# Patient Record
Sex: Female | Born: 1955 | Race: White | Hispanic: No | Marital: Married | Smoking: Never smoker
Health system: Southern US, Community
[De-identification: ages and names within clinical notes are randomized; demographics above are authoritative.]

## PROBLEM LIST (undated history)

## (undated) DIAGNOSIS — N6320 Unspecified lump in the left breast, unspecified quadrant: Secondary | ICD-10-CM

## (undated) DIAGNOSIS — I10 Essential (primary) hypertension: Secondary | ICD-10-CM

## (undated) DIAGNOSIS — R51 Headache: Secondary | ICD-10-CM

## (undated) DIAGNOSIS — R519 Headache, unspecified: Secondary | ICD-10-CM

## (undated) DIAGNOSIS — M1990 Unspecified osteoarthritis, unspecified site: Secondary | ICD-10-CM

## (undated) HISTORY — PX: TUBAL LIGATION: SHX77

## (undated) HISTORY — PX: APPENDECTOMY: SHX54

## (undated) HISTORY — PX: CHOLECYSTECTOMY: SHX55

## (undated) HISTORY — PX: TONSILLECTOMY: SUR1361

---

## 2006-10-06 ENCOUNTER — Encounter: Admission: RE | Admit: 2006-10-06 | Discharge: 2006-10-06 | Payer: Self-pay | Admitting: *Deleted

## 2010-06-27 ENCOUNTER — Encounter: Payer: Self-pay | Admitting: Family Medicine

## 2015-11-11 ENCOUNTER — Ambulatory Visit: Payer: Self-pay | Admitting: Physical Therapy

## 2016-08-04 ENCOUNTER — Other Ambulatory Visit: Payer: Self-pay | Admitting: Family Medicine

## 2016-08-04 DIAGNOSIS — R921 Mammographic calcification found on diagnostic imaging of breast: Secondary | ICD-10-CM

## 2016-08-04 DIAGNOSIS — N6489 Other specified disorders of breast: Secondary | ICD-10-CM

## 2016-08-16 ENCOUNTER — Ambulatory Visit
Admission: RE | Admit: 2016-08-16 | Discharge: 2016-08-16 | Disposition: A | Discharge status: Home / Self Care | Payer: 59 | Source: Ambulatory Visit | Attending: Family Medicine | Admitting: Family Medicine

## 2016-08-16 ENCOUNTER — Other Ambulatory Visit: Payer: Self-pay | Admitting: Family Medicine

## 2016-08-16 DIAGNOSIS — R921 Mammographic calcification found on diagnostic imaging of breast: Secondary | ICD-10-CM

## 2016-08-16 DIAGNOSIS — N6489 Other specified disorders of breast: Secondary | ICD-10-CM

## 2016-08-29 ENCOUNTER — Ambulatory Visit: Payer: Self-pay | Admitting: Surgery

## 2016-08-29 DIAGNOSIS — N6320 Unspecified lump in the left breast, unspecified quadrant: Secondary | ICD-10-CM

## 2016-08-29 NOTE — H&P (Signed)
History of Present Illness Caitlyn Cabrera. Caitlyn Smail MD; 08/29/2016 9:57 AM) Patient words: New-L breast intraductal papillomas.  The patient is a 61 year old female who presents with a breast mass. PCP - Dr. Gar Ponto Referred for evaluation of left lateral breast complex sclerosing lesion  This is a 61 year old female in good health who presents after recent routine screening mammogram. The patient has had previous core biopsies on the right side that showed only papillomas. However this mammogram showed some areas of asymmetry on the left. She had an area of distortion in the lateral left breast. She had a total of 3 stereotactic biopsies performed on 08/16/16. The left lateral area of distortion shows fibrocystic changes with usual ductal hyperplasia apicoectomy metaplasia and focal sclerosis but cannot exclude a complex sclerosing lesion. The other 2 areas of biopsy were benign with fibrocystic changes and usual ductal hyperplasia. She presents now to discuss possible excision of the lateral breast distortion site. This is marked with an X-shaped clip.  The patient has a family history of breast cancer in a maternal aunt. She has never had any previous breast surgery.  CLINICAL DATA: Left lateral breast architectural distortion, left medial breast calcifications and left upper outer quadrant nodule. Patient has a history of multiple left breast papillomas.  EXAM: LEFT BREAST STEREOTACTIC CORE NEEDLE BIOPSY  COMPARISON: Previous exams.  FINDINGS: The patient and I discussed the procedure of stereotactic-guided biopsy including benefits and alternatives. We discussed the high likelihood of a successful procedure. We discussed the risks of the procedure including infection, bleeding, tissue injury, clip migration, and inadequate sampling. Informed written consent was given. The usual time out protocol was performed immediately prior to the procedure.  Using sterile technique and 1%  Lidocaine as local anesthetic, under stereotactic guidance, a 9 gauge vacuum assisted device was used to perform core needle biopsy of left lateral breast distortion using a superior approach. Specimen radiograph was performed. At the conclusion of the procedure, a X shaped tissue marker clip was deployed into the biopsy cavity.  Using sterile technique and 1% Lidocaine as local anesthetic, under stereotactic guidance, a 9 gauge vacuum assisted device was used to perform core needle biopsy of left medial breast group of calcifications using a superior approach. Specimen radiograph was performed and marked for the pathologist. At the conclusion of the procedure, a ribbon shaped tissue marker clip was deployed into the biopsy cavity.  Using sterile technique and 1% Lidocaine as local anesthetic, under stereotactic guidance, a 9 gauge vacuum assisted device was used to perform core needle biopsy of left breast upper outer quadrant nodule using a superior approach. Specimen radiograph was performed and marked for the pathologist. At the conclusion of the procedure, a coil shaped tissue marker clip was deployed into the biopsy cavity.  Follow-up 2-view mammogram was performed and dictated separately.  IMPRESSION: Stereotactic-guided biopsy of left breast x 3. No apparent complications.  Electronically Signed: By: Fidela Salisbury M.D. On: 08/16/2016 12:24  ADDENDUM REPORT: 08/19/2016 09:00  ADDENDUM: Pathology revealed FIBROCYSTIC CHANGES WITH FLORID USUAL DUCTAL HYPERPLASIA, APOCRINE METAPLASIA, AND COLUMNAR CELL CHANGE, FOCAL SCLEROSIS PRESENT of the Left breast lateral distortion. There is focal sclerosis present and it is difficult to entirely exclude sampling of a complex sclerosing lesion but the findings are not definitive. This was found to be concordant byDr. Claudie Revering, with excision recommended. 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 FIBROCYSTIC CHANGES WITH FLORID USUAL DUCTAL HYPERPLASIA, APOCRINE METAPLASIA AND COLUMNAR  CELL CHANGE, ASSOCIATED CALCIFICATIONS of the Left breast, upper outer quadrant. This was found to be concordant by Dr. Claudie Revering. Pathology results were discussed with the patient by telephone. The patient reported doing well after the biopsies with tenderness at the sites. Post biopsy instructions and care were reviewed and questions were answered. The patient was encouraged to call [facility] for any additional concerns. 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 A surgical referral will be arranged by Dr. Gar Ponto of [facility] in [place], [place] per patient request. The patient was asked to return for Right diagnostic mammography and possible ultrasound in 6 months and informed a reminder notice would be sent regarding this appointment.  Pathology results reported by Terie Purser, RN on 08/19/2016.   Electronically Signed By: Fidela Salisbury M.D. On: 08/19/2016 09:00    Past Surgical History Malachi Bonds, CMA; 08/29/2016 9:20 AM) Appendectomy Breast Biopsy Bilateral. Gallbladder Surgery - Laparoscopic Tonsillectomy  Diagnostic Studies History Malachi Bonds, CMA; 08/29/2016 9:20 AM) Colonoscopy 5-10 years ago Mammogram never Pap Smear 1-5 years ago  Allergies Malachi Bonds, CMA; 08/29/2016 9:21 AM) No Known Drug Allergies 08/29/2016  Medication History (Chemira Jones, CMA; 08/29/2016 9:22 AM) Phentermine HCl (37.5MG  Tablet, Oral) Active. Lisinopril-Hydrochlorothiazide (10-12.5MG  Tablet, Oral) Active. Vitamin D3 (1000UNIT Capsule, Oral) Active. Medications Reconciled  Social History Malachi Bonds, CMA; 08/29/2016 9:20 AM) Caffeine use Coffee, Tea. No alcohol use No drug use Tobacco use  Former smoker.  Family History Malachi Bonds, CMA; 08/29/2016 9:20 AM) Breast Cancer Family Members In General. Diabetes Mellitus Father. Hypertension Father, Mother.  Pregnancy / Birth History Malachi Bonds, CMA; 08/29/2016 9:20 AM) Age at menarche 55 years. Age of menopause 7-60 Gravida 2 Irregular periods Maternal age 2-20 Para 2  Other Problems Malachi Bonds, CMA; 08/29/2016 9:20 AM) Arthritis High blood pressure     Review of Systems (Chemira Jones CMA; 08/29/2016 9:20 AM) General Not Present- Appetite Loss, Chills, Fatigue, Fever, Night Sweats, Weight Gain and Weight Loss. Skin Not Present- Change in Wart/Mole, Dryness, Hives, Jaundice, New Lesions, Non-Healing Wounds, Rash and Ulcer. HEENT Not Present- Earache, Hearing Loss, Hoarseness, Nose Bleed, Oral Ulcers, Ringing in the Ears, Seasonal Allergies, Sinus Pain, Sore Throat, Visual Disturbances, Wears glasses/contact lenses and Yellow Eyes. Respiratory Not Present- Bloody sputum, Chronic Cough, Difficulty Breathing, Snoring and Wheezing. Breast Not Present- Breast Mass, Breast Pain, Nipple Discharge and Skin Changes. Cardiovascular Not Present- Chest Pain, Difficulty Breathing Lying Down, Leg Cramps, Palpitations, Rapid Heart Rate, Shortness of Breath and Swelling of Extremities. Gastrointestinal Not Present- Abdominal Pain, Bloating, Bloody Stool, Change in Bowel Habits, Chronic diarrhea, Constipation, Difficulty Swallowing, Excessive gas, Gets full quickly at meals, Hemorrhoids, Indigestion, Nausea, Rectal Pain and Vomiting. Female Genitourinary Not Present- Frequency, Nocturia, Painful Urination, Pelvic Pain and Urgency. Musculoskeletal Not Present- Back Pain, Joint Pain, Joint Stiffness, Muscle Pain, Muscle Weakness and Swelling of Extremities. Neurological Not Present- Decreased Memory, Fainting, Headaches, Numbness, Seizures, Tingling, Tremor, Trouble walking and Weakness. Psychiatric Not Present- Anxiety,  Bipolar, Change in Sleep Pattern, Depression, Fearful and Frequent crying. Endocrine Not Present- Cold Intolerance, Excessive Hunger, Hair Changes, Heat Intolerance, Hot flashes and New Diabetes. Hematology Present- Easy Bruising. Not Present- Blood Thinners, Excessive bleeding, Gland problems, HIV and Persistent Infections.  Vitals (Chemira Jones CMA; 08/29/2016 9:20 AM) 08/29/2016 9:20 AM Weight: 223.6 lb Height: 64in Body Surface Area: 2.05 m Body Mass Index: 38.38 kg/m  Temp.: 98.42F(Oral)  Pulse: 89 (Regular)  BP: 122/88 (Sitting, Left Arm, Standard)      Physical Exam Rodman Key K. Shey Bartmess MD; 08/29/2016 9:58 AM)  The physical exam findings are as follows: Note:WDWN in NAD Eyes: Pupils equal, round; sclera anicteric HENT: Oral mucosa moist; good dentition Neck: No masses palpated, no thyromegaly Lungs: CTA bilaterally; normal respiratory effort Breasts: pendulous, symmetric; no nipple retraction or discharge; no axillary lymphadenopathy; bilateral fibrocystic changes without dominant masses; upper outer quadrant of left breast shows some residual bruising from biopsy CV: Regular rate and rhythm; no murmurs; extremities well-perfused with no edema Abd: +bowel sounds, soft, non-tender, no palpable organomegaly; no palpable hernias Skin: Warm, dry; no sign of jaundice Psychiatric - alert and oriented x 4; calm mood and affect    Assessment & Plan Rodman Key K. Ivie Savitt MD; 08/29/2016 9:52 AM)  MASS OF LEFT BREAST ON MAMMOGRAM (N63.20) Impression: Possible complex sclerosing lesion - left lateral breast 3:00  Current Plans Schedule for Surgery - Left radioactive seed localized lumpectomy. The surgical procedure has been discussed with the patient. Potential risks, benefits, alternative treatments, and expected outcomes have been explained. All of the patient's questions at this time have been answered. The likelihood of reaching the patient's treatment goal is good. The  patient understand the proposed surgical procedure and wishes to proceed.  The [place] system was checked and the patient is not on any chronic pain medication. We discussed her post-operative pain management plan.  Caitlyn Cabrera. Georgette Dover, MD, [facility]  General/ Trauma Surgery  08/29/2016 9:58 AM

## 2016-09-13 ENCOUNTER — Other Ambulatory Visit: Payer: Self-pay | Admitting: Surgery

## 2016-09-13 DIAGNOSIS — N6320 Unspecified lump in the left breast, unspecified quadrant: Secondary | ICD-10-CM

## 2016-10-20 ENCOUNTER — Encounter (HOSPITAL_BASED_OUTPATIENT_CLINIC_OR_DEPARTMENT_OTHER): Payer: Self-pay | Admitting: *Deleted

## 2016-10-21 ENCOUNTER — Other Ambulatory Visit: Payer: Self-pay

## 2016-10-21 ENCOUNTER — Encounter (HOSPITAL_BASED_OUTPATIENT_CLINIC_OR_DEPARTMENT_OTHER)
Admission: RE | Admit: 2016-10-21 | Discharge: 2016-10-21 | Disposition: A | Discharge status: Home / Self Care | Payer: BLUE CROSS/BLUE SHIELD | Source: Ambulatory Visit | Attending: Surgery | Admitting: Surgery

## 2016-10-21 DIAGNOSIS — I10 Essential (primary) hypertension: Secondary | ICD-10-CM | POA: Insufficient documentation

## 2016-10-21 DIAGNOSIS — Z01810 Encounter for preprocedural cardiovascular examination: Secondary | ICD-10-CM | POA: Diagnosis present

## 2016-10-21 NOTE — Progress Notes (Signed)
Pt given Boost drink by J.Linder RN and instructed to drink day of surgery with teach back method.

## 2016-10-26 ENCOUNTER — Ambulatory Visit
Admission: RE | Admit: 2016-10-26 | Discharge: 2016-10-26 | Disposition: A | Discharge status: Home / Self Care | Payer: BLUE CROSS/BLUE SHIELD | Source: Ambulatory Visit | Attending: Surgery | Admitting: Surgery

## 2016-10-26 ENCOUNTER — Other Ambulatory Visit: Payer: Self-pay | Admitting: Surgery

## 2016-10-26 DIAGNOSIS — N6320 Unspecified lump in the left breast, unspecified quadrant: Secondary | ICD-10-CM

## 2016-10-27 ENCOUNTER — Ambulatory Visit
Admission: RE | Admit: 2016-10-27 | Discharge: 2016-10-27 | Disposition: A | Discharge status: Home / Self Care | Payer: BLUE CROSS/BLUE SHIELD | Source: Ambulatory Visit | Attending: Surgery | Admitting: Surgery

## 2016-10-27 ENCOUNTER — Ambulatory Visit (HOSPITAL_BASED_OUTPATIENT_CLINIC_OR_DEPARTMENT_OTHER): Payer: BLUE CROSS/BLUE SHIELD | Admitting: Anesthesiology

## 2016-10-27 ENCOUNTER — Encounter (HOSPITAL_BASED_OUTPATIENT_CLINIC_OR_DEPARTMENT_OTHER)
Admission: RE | Disposition: A | Discharge status: Home / Self Care | Payer: Self-pay | Source: Ambulatory Visit | Attending: Surgery

## 2016-10-27 ENCOUNTER — Ambulatory Visit (HOSPITAL_BASED_OUTPATIENT_CLINIC_OR_DEPARTMENT_OTHER)
Admission: RE | Admit: 2016-10-27 | Discharge: 2016-10-27 | Disposition: A | Discharge status: Home / Self Care | Payer: BLUE CROSS/BLUE SHIELD | Source: Ambulatory Visit | Attending: Surgery | Admitting: Surgery

## 2016-10-27 ENCOUNTER — Ambulatory Visit: Admit: 2016-10-27 | Payer: BLUE CROSS/BLUE SHIELD

## 2016-10-27 ENCOUNTER — Ambulatory Visit
Admit: 2016-10-27 | Discharge: 2016-10-27 | Disposition: A | Discharge status: Home / Self Care | Payer: BLUE CROSS/BLUE SHIELD | Attending: Surgery | Admitting: Surgery

## 2016-10-27 ENCOUNTER — Encounter (HOSPITAL_BASED_OUTPATIENT_CLINIC_OR_DEPARTMENT_OTHER): Payer: Self-pay

## 2016-10-27 DIAGNOSIS — I10 Essential (primary) hypertension: Secondary | ICD-10-CM | POA: Diagnosis not present

## 2016-10-27 DIAGNOSIS — Z79899 Other long term (current) drug therapy: Secondary | ICD-10-CM | POA: Diagnosis not present

## 2016-10-27 DIAGNOSIS — D242 Benign neoplasm of left breast: Secondary | ICD-10-CM | POA: Insufficient documentation

## 2016-10-27 DIAGNOSIS — N6082 Other benign mammary dysplasias of left breast: Secondary | ICD-10-CM | POA: Insufficient documentation

## 2016-10-27 DIAGNOSIS — N6320 Unspecified lump in the left breast, unspecified quadrant: Secondary | ICD-10-CM

## 2016-10-27 DIAGNOSIS — N6092 Unspecified benign mammary dysplasia of left breast: Secondary | ICD-10-CM | POA: Diagnosis not present

## 2016-10-27 DIAGNOSIS — Z803 Family history of malignant neoplasm of breast: Secondary | ICD-10-CM | POA: Insufficient documentation

## 2016-10-27 DIAGNOSIS — Z87891 Personal history of nicotine dependence: Secondary | ICD-10-CM | POA: Insufficient documentation

## 2016-10-27 HISTORY — DX: Unspecified osteoarthritis, unspecified site: M19.90

## 2016-10-27 HISTORY — PX: BREAST LUMPECTOMY WITH RADIOACTIVE SEED LOCALIZATION: SHX6424

## 2016-10-27 HISTORY — DX: Headache: R51

## 2016-10-27 HISTORY — DX: Essential (primary) hypertension: I10

## 2016-10-27 HISTORY — DX: Headache, unspecified: R51.9

## 2016-10-27 HISTORY — DX: Unspecified lump in the left breast, unspecified quadrant: N63.20

## 2016-10-27 SURGERY — BREAST LUMPECTOMY WITH RADIOACTIVE SEED LOCALIZATION
Anesthesia: General | Site: Breast | Laterality: Left

## 2016-10-27 MED ORDER — GABAPENTIN 300 MG PO CAPS
ORAL_CAPSULE | ORAL | Status: AC
  Filled 2016-10-27: qty 1

## 2016-10-27 MED ORDER — BUPIVACAINE-EPINEPHRINE 0.25% -1:200000 IJ SOLN
INTRAMUSCULAR | Status: DC | PRN
  Administered 2016-10-27: 10 mL

## 2016-10-27 MED ORDER — DEXAMETHASONE SODIUM PHOSPHATE 4 MG/ML IJ SOLN
INTRAMUSCULAR | Status: DC | PRN
  Administered 2016-10-27: 10 mg via INTRAVENOUS

## 2016-10-27 MED ORDER — MIDAZOLAM HCL 2 MG/2ML IJ SOLN
INTRAMUSCULAR | Status: AC
  Filled 2016-10-27: qty 2

## 2016-10-27 MED ORDER — MIDAZOLAM HCL 2 MG/2ML IJ SOLN
1.0000 mg | INTRAMUSCULAR | Status: DC | PRN
  Administered 2016-10-27: 2 mg via INTRAVENOUS

## 2016-10-27 MED ORDER — LACTATED RINGERS IV SOLN
INTRAVENOUS | Status: DC
  Administered 2016-10-27 (×2): via INTRAVENOUS

## 2016-10-27 MED ORDER — PROPOFOL 10 MG/ML IV BOLUS
INTRAVENOUS | Status: AC
Start: 2016-10-27 — End: 2016-10-27
  Filled 2016-10-27: qty 20

## 2016-10-27 MED ORDER — HYDROCODONE-ACETAMINOPHEN 5-325 MG PO TABS: 1.0000 | ORAL_TABLET | Freq: Four times a day (QID) | ORAL | 0 refills | Status: AC | PRN

## 2016-10-27 MED ORDER — CELECOXIB 200 MG PO CAPS
ORAL_CAPSULE | ORAL | Status: AC
  Filled 2016-10-27: qty 2

## 2016-10-27 MED ORDER — ACETAMINOPHEN 500 MG PO TABS
ORAL_TABLET | ORAL | Status: AC
  Filled 2016-10-27: qty 2

## 2016-10-27 MED ORDER — PROMETHAZINE HCL 25 MG/ML IJ SOLN: 6.2500 mg | INTRAMUSCULAR | Status: DC | PRN

## 2016-10-27 MED ORDER — SCOPOLAMINE 1 MG/3DAYS TD PT72: 1.0000 | MEDICATED_PATCH | Freq: Once | TRANSDERMAL | Status: DC | PRN

## 2016-10-27 MED ORDER — FENTANYL CITRATE (PF) 100 MCG/2ML IJ SOLN
50.0000 ug | INTRAMUSCULAR | Status: AC | PRN
  Administered 2016-10-27: 50 ug via INTRAVENOUS
  Administered 2016-10-27 (×2): 25 ug via INTRAVENOUS

## 2016-10-27 MED ORDER — HYDROCODONE-ACETAMINOPHEN 5-325 MG PO TABS
ORAL_TABLET | ORAL | Status: AC
  Filled 2016-10-27: qty 1

## 2016-10-27 MED ORDER — HYDROCODONE-ACETAMINOPHEN 5-325 MG PO TABS
1.0000 | ORAL_TABLET | Freq: Once | ORAL | Status: AC
  Administered 2016-10-27: 1 via ORAL

## 2016-10-27 MED ORDER — CEFAZOLIN SODIUM-DEXTROSE 2-4 GM/100ML-% IV SOLN
2.0000 g | INTRAVENOUS | Status: AC
Start: 2016-10-27 — End: 2016-10-27
  Administered 2016-10-27: 2 g via INTRAVENOUS

## 2016-10-27 MED ORDER — CELECOXIB 400 MG PO CAPS
400.0000 mg | ORAL_CAPSULE | ORAL | Status: AC
  Administered 2016-10-27: 400 mg via ORAL

## 2016-10-27 MED ORDER — ACETAMINOPHEN 500 MG PO TABS
1000.0000 mg | ORAL_TABLET | ORAL | Status: AC
Start: 2016-10-27 — End: 2016-10-27
  Administered 2016-10-27: 1000 mg via ORAL

## 2016-10-27 MED ORDER — DEXAMETHASONE SODIUM PHOSPHATE 10 MG/ML IJ SOLN
INTRAMUSCULAR | Status: AC
  Filled 2016-10-27: qty 1

## 2016-10-27 MED ORDER — LIDOCAINE 2% (20 MG/ML) 5 ML SYRINGE
INTRAMUSCULAR | Status: DC | PRN
  Administered 2016-10-27: 60 mg via INTRAVENOUS

## 2016-10-27 MED ORDER — ONDANSETRON HCL 4 MG/2ML IJ SOLN
INTRAMUSCULAR | Status: AC
  Filled 2016-10-27: qty 2

## 2016-10-27 MED ORDER — LIDOCAINE 2% (20 MG/ML) 5 ML SYRINGE
INTRAMUSCULAR | Status: AC
  Filled 2016-10-27: qty 5

## 2016-10-27 MED ORDER — KETOROLAC TROMETHAMINE 30 MG/ML IJ SOLN: 30.0000 mg | Freq: Once | INTRAMUSCULAR | Status: DC | PRN

## 2016-10-27 MED ORDER — HYDROCODONE-ACETAMINOPHEN 7.5-325 MG PO TABS: 1.0000 | ORAL_TABLET | Freq: Once | ORAL | Status: DC | PRN

## 2016-10-27 MED ORDER — HYDROMORPHONE HCL 1 MG/ML IJ SOLN: 0.2500 mg | INTRAMUSCULAR | Status: DC | PRN

## 2016-10-27 MED ORDER — CEFAZOLIN SODIUM-DEXTROSE 2-4 GM/100ML-% IV SOLN
INTRAVENOUS | Status: AC
  Filled 2016-10-27: qty 100

## 2016-10-27 MED ORDER — GABAPENTIN 300 MG PO CAPS
300.0000 mg | ORAL_CAPSULE | ORAL | Status: AC
  Administered 2016-10-27: 300 mg via ORAL

## 2016-10-27 MED ORDER — PROPOFOL 10 MG/ML IV BOLUS
INTRAVENOUS | Status: DC | PRN
  Administered 2016-10-27: 200 mg via INTRAVENOUS

## 2016-10-27 MED ORDER — CHLORHEXIDINE GLUCONATE CLOTH 2 % EX PADS: 6.0000 | MEDICATED_PAD | Freq: Once | CUTANEOUS | Status: DC

## 2016-10-27 MED ORDER — EPHEDRINE SULFATE-NACL 50-0.9 MG/10ML-% IV SOSY
PREFILLED_SYRINGE | INTRAVENOUS | Status: DC | PRN
  Administered 2016-10-27: 5 mg via INTRAVENOUS
  Administered 2016-10-27: 10 mg via INTRAVENOUS

## 2016-10-27 MED ORDER — ONDANSETRON HCL 4 MG/2ML IJ SOLN
INTRAMUSCULAR | Status: DC | PRN
  Administered 2016-10-27: 4 mg via INTRAVENOUS

## 2016-10-27 MED ORDER — FENTANYL CITRATE (PF) 100 MCG/2ML IJ SOLN
INTRAMUSCULAR | Status: AC
  Filled 2016-10-27: qty 2

## 2016-10-27 SURGICAL SUPPLY — 57 items
APL SKNCLS STERI-STRIP NONHPOA (GAUZE/BANDAGES/DRESSINGS) ×1
APPLIER CLIP 9.375 MED OPEN (MISCELLANEOUS) ×3
APR CLP MED 9.3 20 MLT OPN (MISCELLANEOUS) ×1
BENZOIN TINCTURE PRP APPL 2/3 (GAUZE/BANDAGES/DRESSINGS) ×3 IMPLANT
BLADE HEX COATED 2.75 (ELECTRODE) ×3 IMPLANT
BLADE SURG 15 STRL LF DISP TIS (BLADE) ×1 IMPLANT
BLADE SURG 15 STRL SS (BLADE) ×3
CANISTER SUC SOCK COL 7IN (MISCELLANEOUS) ×3 IMPLANT
CANISTER SUCT 1200ML W/VALVE (MISCELLANEOUS) ×3 IMPLANT
CHLORAPREP W/TINT 26ML (MISCELLANEOUS) ×3 IMPLANT
CLIP APPLIE 9.375 MED OPEN (MISCELLANEOUS) ×1 IMPLANT
CLOSURE WOUND 1/2 X4 (GAUZE/BANDAGES/DRESSINGS) ×1
COVER BACK TABLE 60X90IN (DRAPES) ×3 IMPLANT
COVER MAYO STAND STRL (DRAPES) ×3 IMPLANT
COVER PROBE W GEL 5X96 (DRAPES) ×3 IMPLANT
DECANTER SPIKE VIAL GLASS SM (MISCELLANEOUS) IMPLANT
DEVICE DUBIN W/COMP PLATE 8390 (MISCELLANEOUS) ×6 IMPLANT
DRAPE LAPAROTOMY 100X72 PEDS (DRAPES) ×3 IMPLANT
DRAPE UTILITY XL STRL (DRAPES) ×3 IMPLANT
DRSG TEGADERM 4X4.75 (GAUZE/BANDAGES/DRESSINGS) ×3 IMPLANT
ELECT BLADE 4.0 EZ CLEAN MEGAD (MISCELLANEOUS) ×3
ELECT REM PT RETURN 9FT ADLT (ELECTROSURGICAL) ×3
ELECTRODE BLDE 4.0 EZ CLN MEGD (MISCELLANEOUS) ×1 IMPLANT
ELECTRODE REM PT RTRN 9FT ADLT (ELECTROSURGICAL) ×1 IMPLANT
GAUZE SPONGE 4X4 12PLY STRL LF (GAUZE/BANDAGES/DRESSINGS) ×3 IMPLANT
GLOVE BIO SURGEON STRL SZ7 (GLOVE) ×3 IMPLANT
GLOVE BIOGEL PI IND STRL 7.0 (GLOVE) ×2 IMPLANT
GLOVE BIOGEL PI IND STRL 7.5 (GLOVE) ×1 IMPLANT
GLOVE BIOGEL PI INDICATOR 7.0 (GLOVE) ×4
GLOVE BIOGEL PI INDICATOR 7.5 (GLOVE) ×2
GLOVE ECLIPSE 6.5 STRL STRAW (GLOVE) ×3 IMPLANT
GOWN STRL REUS W/ TWL LRG LVL3 (GOWN DISPOSABLE) ×2 IMPLANT
GOWN STRL REUS W/TWL LRG LVL3 (GOWN DISPOSABLE) ×6
ILLUMINATOR WAVEGUIDE N/F (MISCELLANEOUS) IMPLANT
KIT MARKER MARGIN INK (KITS) ×3 IMPLANT
LIGHT WAVEGUIDE WIDE FLAT (MISCELLANEOUS) ×3 IMPLANT
NEEDLE HYPO 25X1 1.5 SAFETY (NEEDLE) ×3 IMPLANT
NS IRRIG 1000ML POUR BTL (IV SOLUTION) ×3 IMPLANT
PACK BASIN DAY SURGERY FS (CUSTOM PROCEDURE TRAY) ×3 IMPLANT
PENCIL BUTTON HOLSTER BLD 10FT (ELECTRODE) ×3 IMPLANT
SLEEVE SCD COMPRESS KNEE MED (MISCELLANEOUS) ×3 IMPLANT
SPONGE GAUZE 2X2 8PLY STER LF (GAUZE/BANDAGES/DRESSINGS)
SPONGE GAUZE 2X2 8PLY STRL LF (GAUZE/BANDAGES/DRESSINGS) IMPLANT
SPONGE LAP 18X18 X RAY DECT (DISPOSABLE) IMPLANT
SPONGE LAP 4X18 X RAY DECT (DISPOSABLE) ×3 IMPLANT
STRIP CLOSURE SKIN 1/2X4 (GAUZE/BANDAGES/DRESSINGS) ×2 IMPLANT
SUT MON AB 4-0 PC3 18 (SUTURE) ×3 IMPLANT
SUT SILK 2 0 SH (SUTURE) IMPLANT
SUT VIC AB 3-0 SH 27 (SUTURE) ×3
SUT VIC AB 3-0 SH 27X BRD (SUTURE) ×1 IMPLANT
SYR BULB 3OZ (MISCELLANEOUS) ×3 IMPLANT
SYR CONTROL 10ML LL (SYRINGE) ×3 IMPLANT
TOWEL OR 17X24 6PK STRL BLUE (TOWEL DISPOSABLE) ×3 IMPLANT
TOWEL OR NON WOVEN STRL DISP B (DISPOSABLE) ×3 IMPLANT
TUBE CONNECTING 20'X1/4 (TUBING) ×1
TUBE CONNECTING 20X1/4 (TUBING) ×2 IMPLANT
YANKAUER SUCT BULB TIP NO VENT (SUCTIONS) ×3 IMPLANT

## 2016-10-27 NOTE — Anesthesia Procedure Notes (Signed)
Procedure Name: LMA Insertion Date/Time: 10/27/2016 9:41 AM Performed by: Denna Haggard D Pre-anesthesia Checklist: Patient identified, Emergency Drugs available, Suction available and Patient being monitored Patient Re-evaluated:Patient Re-evaluated prior to inductionOxygen Delivery Method: Circle system utilized Preoxygenation: Pre-oxygenation with 100% oxygen Intubation Type: IV induction Ventilation: Mask ventilation without difficulty LMA: LMA inserted LMA Size: 4.0 Number of attempts: 1 Airway Equipment and Method: Bite block Placement Confirmation: positive ETCO2 Tube secured with: Tape Dental Injury: Teeth and Oropharynx as per pre-operative assessment

## 2016-10-27 NOTE — Anesthesia Preprocedure Evaluation (Signed)
Anesthesia Evaluation  Patient identified by MRN, date of birth, ID band Patient awake    Reviewed: Allergy & Precautions, NPO status , Patient's Chart, lab work & pertinent test results  Airway Mallampati: II  TM Distance: >3 FB Neck ROM: Full    Dental no notable dental hx.    Pulmonary neg pulmonary ROS,    Pulmonary exam normal breath sounds clear to auscultation       Cardiovascular hypertension, negative cardio ROS Normal cardiovascular exam Rhythm:Regular Rate:Normal     Neuro/Psych negative neurological ROS  negative psych ROS   GI/Hepatic negative GI ROS, Neg liver ROS,   Endo/Other  negative endocrine ROS  Renal/GU negative Renal ROS  negative genitourinary   Musculoskeletal negative musculoskeletal ROS (+)   Abdominal   Peds negative pediatric ROS (+)  Hematology negative hematology ROS (+)   Anesthesia Other Findings   Reproductive/Obstetrics negative OB ROS                             Anesthesia Physical Anesthesia Plan  ASA: II  Anesthesia Plan: General   Post-op Pain Management:    Induction: Intravenous  Airway Management Planned: LMA  Additional Equipment:   Intra-op Plan:   Post-operative Plan: Extubation in OR  Informed Consent: I have reviewed the patients History and Physical, chart, labs and discussed the procedure including the risks, benefits and alternatives for the proposed anesthesia with the patient or authorized representative who has indicated his/her understanding and acceptance.   Dental advisory given  Plan Discussed with:   Anesthesia Plan Comments:         Anesthesia Quick Evaluation

## 2016-10-27 NOTE — Discharge Instructions (Signed)
[facility] °Office Phone Number [phone] ° °BREAST BIOPSY/ PARTIAL MASTECTOMY: POST OP INSTRUCTIONS ° °Always review your discharge instruction sheet given to you by the facility where your surgery was performed. ° °IF YOU HAVE DISABILITY OR FAMILY LEAVE FORMS, YOU MUST BRING THEM TO THE OFFICE FOR PROCESSING.  DO NOT GIVE THEM TO YOUR DOCTOR. ° °1. A prescription for pain medication may be given to you upon discharge.  Take your pain medication as prescribed, if needed.  If narcotic pain medicine is not needed, then you may take acetaminophen (Tylenol) or ibuprofen (Advil) as needed. °2. Take your usually prescribed medications unless otherwise directed °3. If you need a refill on your pain medication, please contact your pharmacy.  They will contact our office to request authorization.  Prescriptions will not be filled after 5pm or on week-ends. °4. You should eat very light the first 24 hours after surgery, such as soup, crackers, pudding, etc.  Resume your normal diet the day after surgery. °5. Most patients will experience some swelling and bruising in the breast.  Ice packs and a good support bra will help.  Swelling and bruising can take several days to resolve.  °6. It is common to experience some constipation if taking pain medication after surgery.  Increasing fluid intake and taking a stool softener will usually help or prevent this problem from occurring.  A mild laxative (Milk of Magnesia or Miralax) should be taken according to package directions if there are no bowel movements after 48 hours. °7. Unless discharge instructions indicate otherwise, you may remove your bandages 24-48 hours after surgery, and you may shower at that time.  You may have steri-strips (small skin tapes) in place directly over the incision.  These strips should be left on the skin for 7-10 days.  If your surgeon used skin glue on the incision, you may shower in 24 hours.  The glue will flake off over the  next 2-3 weeks.  Any sutures or staples will be removed at the office during your follow-up visit. °8. ACTIVITIES:  You may resume regular daily activities (gradually increasing) beginning the next day.  Wearing a good support bra or sports bra minimizes pain and swelling.  You may have sexual intercourse when it is comfortable. °a. You may drive when you no longer are taking prescription pain medication, you can comfortably wear a seatbelt, and you can safely maneuver your car and apply brakes. °b. RETURN TO WORK:  ______________________________________________________________________________________ °9. You should see your doctor in the office for a follow-up appointment approximately two weeks after your surgery.  Your doctor’s nurse will typically make your follow-up appointment when she calls you with your pathology report.  Expect your pathology report 2-3 business days after your surgery.  You may call to check if you do not hear from us after three days. °10. OTHER INSTRUCTIONS: _______________________________________________________________________________________________ _____________________________________________________________________________________________________________________________________ °_____________________________________________________________________________________________________________________________________ °_____________________________________________________________________________________________________________________________________ ° °WHEN TO CALL YOUR DOCTOR: °1. Fever over 101.0 °2. Nausea and/or vomiting. °3. Extreme swelling or bruising. °4. Continued bleeding from incision. °5. Increased pain, redness, or drainage from the incision. ° °The clinic staff is available to answer your questions during regular business hours.  Please don’t hesitate to call and ask to speak to one of the nurses for clinical concerns.  If you have a medical emergency, go to the nearest  emergency room or call 911.  A surgeon from [facility] is always on call at the hospital. ° °For further questions, please visit [email or web address]  ° ° ° ° °  Post Anesthesia Home Care Instructions ° °Activity: °Get plenty of rest for the remainder of the day. A responsible individual must stay with you for 24 hours following the procedure.  °For the next 24 hours, DO NOT: °-Drive a car °-Operate machinery °-Drink alcoholic beverages °-Take any medication unless instructed by your physician °-Make any legal decisions or sign important papers. ° °Meals: °Start with liquid foods such as gelatin or soup. Progress to regular foods as tolerated. Avoid greasy, spicy, heavy foods. If nausea and/or vomiting occur, drink only clear liquids until the nausea and/or vomiting subsides. Call your physician if vomiting continues. ° °Special Instructions/Symptoms: °Your throat may feel dry or sore from the anesthesia or the breathing tube placed in your throat during surgery. If this causes discomfort, gargle with warm salt water. The discomfort should disappear within 24 hours. ° °If you had a scopolamine patch placed behind your ear for the management of post- operative nausea and/or vomiting: ° °1. The medication in the patch is effective for 72 hours, after which it should be removed.  Wrap patch in a tissue and discard in the trash. Wash hands thoroughly with soap and water. °2. You may remove the patch earlier than 72 hours if you experience unpleasant side effects which may include dry mouth, dizziness or visual disturbances. °3. Avoid touching the patch. Wash your hands with soap and water after contact with the patch. °  ° °

## 2016-10-27 NOTE — H&P (Signed)
History of Present Illness Caitlyn Cabrera. Caitlyn Muhlestein MD; 08/29/2016 9:57 AM) Patient words: New-L breast intraductal papillomas.  The patient is a 61 year old female who presents with a breast mass. PCP - Dr. Gar Ponto Referred for evaluation of left lateral breast complex sclerosing lesion  This is a 61 year old female in good health who presents after recent routine screening mammogram. The patient has had previous core biopsies on the right side that showed only papillomas. However this mammogram showed some areas of asymmetry on the left. She had an area of distortion in the lateral left breast. She had a total of 3 stereotactic biopsies performed on 08/16/16. The left lateral area of distortion shows fibrocystic changes with usual ductal hyperplasia apicoectomy metaplasia and focal sclerosis but cannot exclude a complex sclerosing lesion. The other 2 areas of biopsy were benign with fibrocystic changes and usual ductal hyperplasia. She presents now to discuss possible excision of the lateral breast distortion site. This is marked with an X-shaped clip.  The patient had two previous biopsies at [facility] in the left breast that showed intraductal papillomas.  The patient has a family history of breast cancer in a maternal aunt. She has never had any previous breast surgery.  CLINICAL DATA: Left lateral breast architectural distortion, left medial breast calcifications and left upper outer quadrant nodule. Patient has a history of multiple left breast papillomas.  EXAM: LEFT BREAST STEREOTACTIC CORE NEEDLE BIOPSY  COMPARISON: Previous exams.  FINDINGS: The patient and I discussed the procedure of stereotactic-guided biopsy including benefits and alternatives. We discussed the high likelihood of a successful procedure. We discussed the risks of the procedure including infection, bleeding, tissue injury, clip migration, and inadequate sampling. Informed written consent  was given. The usual time out protocol was performed immediately prior to the procedure.  Using sterile technique and 1% Lidocaine as local anesthetic, under stereotactic guidance, a 9 gauge vacuum assisted device was used to perform core needle biopsy of left lateral breast distortion using a superior approach. Specimen radiograph was performed. At the conclusion of the procedure, a X shaped tissue marker clip was deployed into the biopsy cavity.  Using sterile technique and 1% Lidocaine as local anesthetic, under stereotactic guidance, a 9 gauge vacuum assisted device was used to perform core needle biopsy of left medial breast group of calcifications using a superior approach. Specimen radiograph was performed and marked for the pathologist. At the conclusion of the procedure, a ribbon shaped tissue marker clip was deployed into the biopsy cavity.  Using sterile technique and 1% Lidocaine as local anesthetic, under stereotactic guidance, a 9 gauge vacuum assisted device was used to perform core needle biopsy of left breast upper outer quadrant nodule using a superior approach. Specimen radiograph was performed and marked for the pathologist. At the conclusion of the procedure, a coil shaped tissue marker clip was deployed into the biopsy cavity.  Follow-up 2-view mammogram was performed and dictated separately.  IMPRESSION: Stereotactic-guided biopsy of left breast x 3. No apparent complications.  Electronically Signed: By: Fidela Salisbury M.D. On: 08/16/2016 12:24  ADDENDUM REPORT: 08/19/2016 09:00  ADDENDUM: Pathology revealed FIBROCYSTIC CHANGES WITH FLORID USUAL DUCTAL HYPERPLASIA, APOCRINE METAPLASIA, AND COLUMNAR CELL CHANGE, FOCAL SCLEROSIS PRESENT of the Left breast lateral distortion. There is focal sclerosis present and it is difficult to entirely exclude sampling of a complex sclerosing lesion but the findings are not definitive. This was found  to be concordant byDr. Claudie Revering, with excision recommended. Pathology revealed [place]  of the Left breast medial calcifications. FIBROCYSTIC CHANGES WITH FLORID USUAL DUCTAL HYPERPLASIA, APOCRINE METAPLASIA AND COLUMNAR CELL CHANGE, ASSOCIATED CALCIFICATIONS of the Left breast, upper outer quadrant. This was found to be concordant by Dr. Claudie Revering. Pathology results were discussed with the patient by telephone. The patient reported doing well after the biopsies with tenderness at the sites. Post biopsy instructions and care were reviewed and questions were answered. The patient was encouraged to call [facility] for any additional concerns. 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 A surgical referral will be arranged by Dr. Gar Ponto of [facility] in [place], [place] per patient request. The patient was asked to return for Right diagnostic mammography and possible ultrasound in 6 months and informed a reminder notice would be sent regarding this appointment.  Pathology results reported by Terie Purser, RN on 08/19/2016.   Electronically Signed By: Fidela Salisbury M.D. On: 08/19/2016 09:00    Past Surgical History Appendectomy Breast Biopsy Bilateral. Gallbladder Surgery - Laparoscopic Tonsillectomy  Diagnostic Studies History  Colonoscopy 5-10 years ago Mammogram never Pap Smear 1-5 years ago  Allergies  No Known Drug Allergies 08/29/2016  Medication History  Phentermine HCl (37.5MG  Tablet, Oral) Active. Lisinopril-Hydrochlorothiazide (10-12.5MG  Tablet, Oral) Active. Vitamin D3 (1000UNIT Capsule, Oral) Active. Medications Reconciled  Social History  Caffeine use Coffee, Tea. No alcohol use No drug use Tobacco use Former smoker.  Family History  Breast Cancer  Family Members In General. Diabetes Mellitus Father. Hypertension Father, Mother.  Pregnancy / Birth History  Age at menarche 53 years. Age of menopause 19-60 Gravida 2 Irregular periods Maternal age 110-20 Para 2  Other Problems Arthritis High blood pressure     Review of Systems  General Not Present- Appetite Loss, Chills, Fatigue, Fever, Night Sweats, Weight Gain and Weight Loss. Skin Not Present- Change in Wart/Mole, Dryness, Hives, Jaundice, New Lesions, Non-Healing Wounds, Rash and Ulcer. HEENT Not Present- Earache, Hearing Loss, Hoarseness, Nose Bleed, Oral Ulcers, Ringing in the Ears, Seasonal Allergies, Sinus Pain, Sore Throat, Visual Disturbances, Wears glasses/contact lenses and Yellow Eyes. Respiratory Not Present- Bloody sputum, Chronic Cough, Difficulty Breathing, Snoring and Wheezing. Breast Not Present- Breast Mass, Breast Pain, Nipple Discharge and Skin Changes. Cardiovascular Not Present- Chest Pain, Difficulty Breathing Lying Down, Leg Cramps, Palpitations, Rapid Heart Rate, Shortness of Breath and Swelling of Extremities. Gastrointestinal Not Present- Abdominal Pain, Bloating, Bloody Stool, Change in Bowel Habits, Chronic diarrhea, Constipation, Difficulty Swallowing, Excessive gas, Gets full quickly at meals, Hemorrhoids, Indigestion, Nausea, Rectal Pain and Vomiting. Female Genitourinary Not Present- Frequency, Nocturia, Painful Urination, Pelvic Pain and Urgency. Musculoskeletal Not Present- Back Pain, Joint Pain, Joint Stiffness, Muscle Pain, Muscle Weakness and Swelling of Extremities. Neurological Not Present- Decreased Memory, Fainting, Headaches, Numbness, Seizures, Tingling, Tremor, Trouble walking and Weakness. Psychiatric Not Present- Anxiety, Bipolar, Change in Sleep Pattern, Depression, Fearful and Frequent crying. Endocrine Not Present- Cold Intolerance, Excessive Hunger, Hair Changes, Heat Intolerance, Hot flashes and New  Diabetes. Hematology Present- Easy Bruising. Not Present- Blood Thinners, Excessive bleeding, Gland problems, HIV and Persistent Infections.  Vitals  Weight: 223.6 lb Height: 64in Body Surface Area: 2.05 m Body Mass Index: 38.38 kg/m  Temp.: 98.77F(Oral)  Pulse: 89 (Regular)  BP: 122/88 (Sitting, Left Arm, Standard)      Physical Exam   The physical exam findings are as follows: Note:WDWN in NAD Eyes: Pupils equal, round; sclera anicteric HENT: Oral mucosa moist; good dentition Neck:  No masses palpated, no thyromegaly Lungs: CTA bilaterally; normal respiratory effort Breasts: pendulous, symmetric; no nipple retraction or discharge; no axillary lymphadenopathy; bilateral fibrocystic changes without dominant masses; upper outer quadrant of left breast shows some residual bruising from biopsy CV: Regular rate and rhythm; no murmurs; extremities well-perfused with no edema Abd: +bowel sounds, soft, non-tender, no palpable organomegaly; no palpable hernias Skin: Warm, dry; no sign of jaundice Psychiatric - alert and oriented x 4; calm mood and affect    Assessment & Plan   MASS OF LEFT BREAST ON MAMMOGRAM (N63.20) Impression: Possible complex sclerosing lesion - left lateral breast 3:00 Retroareolar intraductal papillomas x 2  Current Plans Schedule for Surgery - Left radioactive seed localized lumpectomy x 3. The surgical procedure has been discussed with the patient. Potential risks, benefits, alternative treatments, and expected outcomes have been explained. All of the patient's questions at this time have been answered. The likelihood of reaching the patient's treatment goal is good. The patient understand the proposed surgical procedure and wishes to proceed.  The [place] system was checked and the patient is not on any chronic pain medication. We discussed her post-operative pain management plan.  Caitlyn Cabrera. Georgette Dover, MD, [facility]  Surgery  General/ Trauma Surgery  10/27/2016 9:19 AM

## 2016-10-27 NOTE — Op Note (Signed)
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 Anesthesia:  GEN - LMA Indications:  This is a 61 year old female who presented with previous core biopsies on her left side that showed intraductal papillomas. She has a new area of distortion the lateral left breast showing complex sclerosing lesion. She presents for excision of all 3. Radioactive seeds replaced yesterday by radiology.  Description of procedure: The patient is brought to the operating room placed in supine position on the operating room table. After an adequate level of general anesthesia was obtained, her left breast was prepped with ChloraPrep and draped sterile fashion. A timeout was taken to ensure the proper patient and proper procedure. We interrogated the breast with the neoprobe. We made a circumareolar incision around the side of the nipple after infiltrating with 0.25% Marcaine. Dissection was carried down in the breast tissue with cautery. We began with the retroareolar papillomas. These seem to be located near each other and anterior posterior orientation. We used the neoprobe to guide [place] towards the radioactive seed. We excised a single area of tissue around the radioactive seeds. The specimen was removed and was oriented with a paint kit. Specimen mammogram showed both radioactive seeds as well as the biopsy clips within the specimen. This was sent for pathologic examination.  We then tunneled laterally the breast tissue towards the third radioactive seed. We excised a single area of tissue around the radioactive seed with a diameter of 2 cm. The specimen was oriented with a paint kit. Specimen mammogram showed the radioactive seed as well as the biopsy clip. This was sent for pathologic examination. We inspected carefully for hemostasis. The wound  was thoroughly irrigated. The wound was closed with a deep layer of 3-0 Vicryl and a subcuticular layer of 4-0 Monocryl. Benzoin Steri-Strips were applied. The patient was then extubated and brought to the recovery room in stable condition. All sponge, instrument, and needle counts are correct.  Imogene Burn. Georgette Dover, MD, [facility] Surgery  General/ Trauma Surgery  10/27/2016 10:54 AM

## 2016-10-27 NOTE — Anesthesia Postprocedure Evaluation (Signed)
Anesthesia Post Note  Patient: Caitlyn Cabrera  Procedure(s) Performed: Procedure(s) (LRB): LEFT BREAST LUMPECTOMY WITH RADIOACTIVE SEED LOCALIZATION (Left)  Patient location during evaluation: PACU Anesthesia Type: General Level of consciousness: awake and alert Pain management: pain level controlled Vital Signs Assessment: post-procedure vital signs reviewed and stable Respiratory status: spontaneous breathing, nonlabored ventilation, respiratory function stable and patient connected to nasal cannula oxygen Cardiovascular status: blood pressure returned to baseline and stable Postop Assessment: no signs of nausea or vomiting Anesthetic complications: no       Last Vitals:  Vitals:   10/27/16 1145 10/27/16 1200  BP: 123/74 119/80  Pulse: 73 74  Resp: 15 17  Temp:      Last Pain:  Vitals:   10/27/16 1230  TempSrc:   PainSc: 6                  Tiajuana Amass

## 2016-10-27 NOTE — Transfer of Care (Signed)
Immediate Anesthesia Transfer of Care Note  Patient: Caitlyn Cabrera  Procedure(s) Performed: Procedure(s) (LRB): LEFT BREAST LUMPECTOMY WITH RADIOACTIVE SEED LOCALIZATION (Left)  Patient Location: PACU  Anesthesia Type: General  Level of Consciousness: awake, oriented, sedated and patient cooperative  Airway & Oxygen Therapy: Patient Spontanous Breathing and Patient connected to face mask oxygen  Post-op Assessment: Report given to PACU RN and Post -op Vital signs reviewed and stable  Post vital signs: Reviewed and stable  Complications: No apparent anesthesia complications  Last Vitals:  Vitals:   10/27/16 0717 10/27/16 1102  BP: 133/86 (P) 122/77  Pulse: 81 86  Resp: 18 (P) 16  Temp: 36.8 C (P) 36.5 C    Last Pain:  Vitals:   10/27/16 0717  TempSrc: Oral

## 2016-10-28 ENCOUNTER — Encounter (HOSPITAL_BASED_OUTPATIENT_CLINIC_OR_DEPARTMENT_OTHER): Payer: Self-pay | Admitting: Surgery

## 2016-10-28 NOTE — Addendum Note (Signed)
Addendum  created 10/28/16 1013 by Ahnna Dungan, Ernesta Amble, CRNA   Charge Capture section accepted

## 2017-04-10 DIAGNOSIS — E6609 Other obesity due to excess calories: Secondary | ICD-10-CM | POA: Diagnosis not present

## 2017-04-10 DIAGNOSIS — I10 Essential (primary) hypertension: Secondary | ICD-10-CM | POA: Diagnosis not present

## 2017-04-10 DIAGNOSIS — Z6834 Body mass index (BMI) 34.0-34.9, adult: Secondary | ICD-10-CM | POA: Diagnosis not present

## 2017-04-10 DIAGNOSIS — M1712 Unilateral primary osteoarthritis, left knee: Secondary | ICD-10-CM | POA: Diagnosis not present

## 2017-10-19 DIAGNOSIS — Z0001 Encounter for general adult medical examination with abnormal findings: Secondary | ICD-10-CM | POA: Diagnosis not present

## 2017-10-23 DIAGNOSIS — Z681 Body mass index (BMI) 19 or less, adult: Secondary | ICD-10-CM | POA: Diagnosis not present

## 2017-10-23 DIAGNOSIS — M1712 Unilateral primary osteoarthritis, left knee: Secondary | ICD-10-CM | POA: Diagnosis not present

## 2017-10-23 DIAGNOSIS — Z9189 Other specified personal risk factors, not elsewhere classified: Secondary | ICD-10-CM | POA: Diagnosis not present

## 2017-10-23 DIAGNOSIS — Z23 Encounter for immunization: Secondary | ICD-10-CM | POA: Diagnosis not present

## 2017-10-23 DIAGNOSIS — Z0001 Encounter for general adult medical examination with abnormal findings: Secondary | ICD-10-CM | POA: Diagnosis not present

## 2017-10-23 DIAGNOSIS — I10 Essential (primary) hypertension: Secondary | ICD-10-CM | POA: Diagnosis not present

## 2017-10-23 DIAGNOSIS — E6609 Other obesity due to excess calories: Secondary | ICD-10-CM | POA: Diagnosis not present

## 2018-05-05 IMAGING — MG NEEDLE LOCALIZATION OF THE LEFT BREAST WITH MAMMO GUIDANCE
8 of 12 series · 8 of 12 positions shown · non-contrast
Comparison: Previous exam(s).

CLINICAL DATA: 52-year-old female presenting for radioactive seed
localization of 2 sites a papillomas in the left breast and 1 area
of distortion in the left breast.

EXAM:
MAMMOGRAPHIC GUIDED RADIOACTIVE SEED LOCALIZATION OF THE LEFT BREAST

[L CC (1 of 3)]
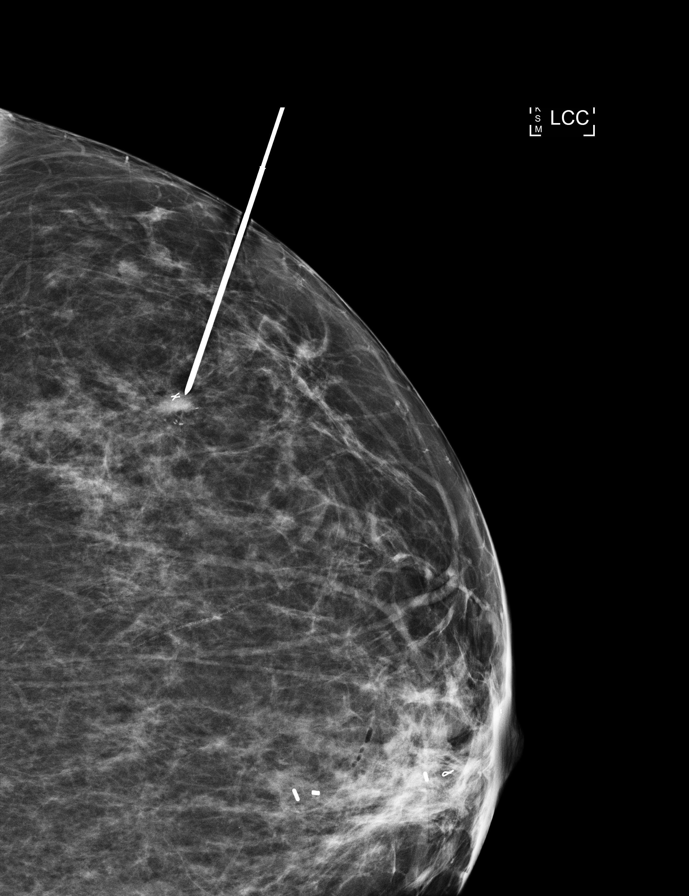

[L ML (1 of 2)]
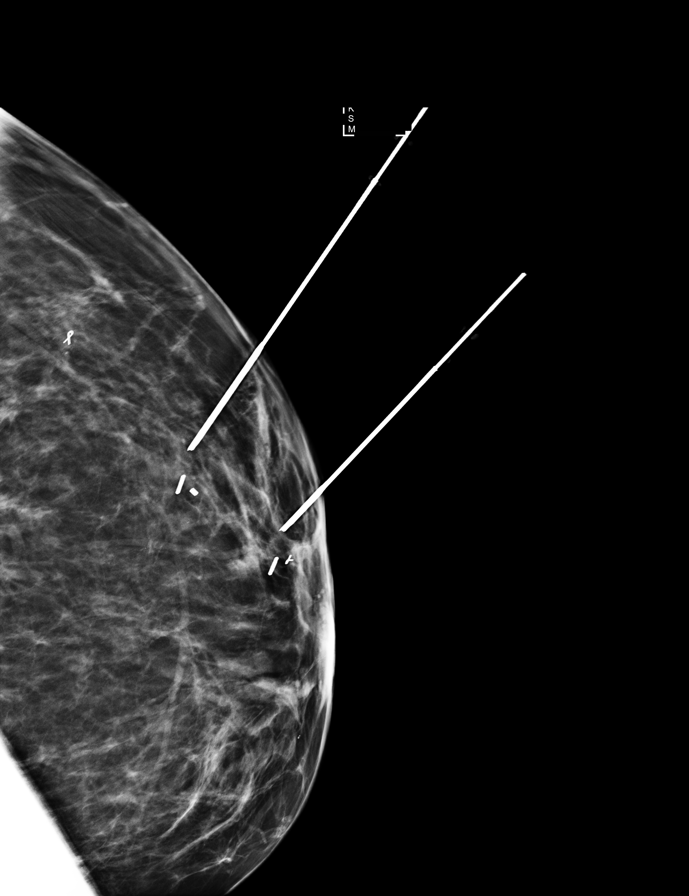

[L ML (2 of 2)]
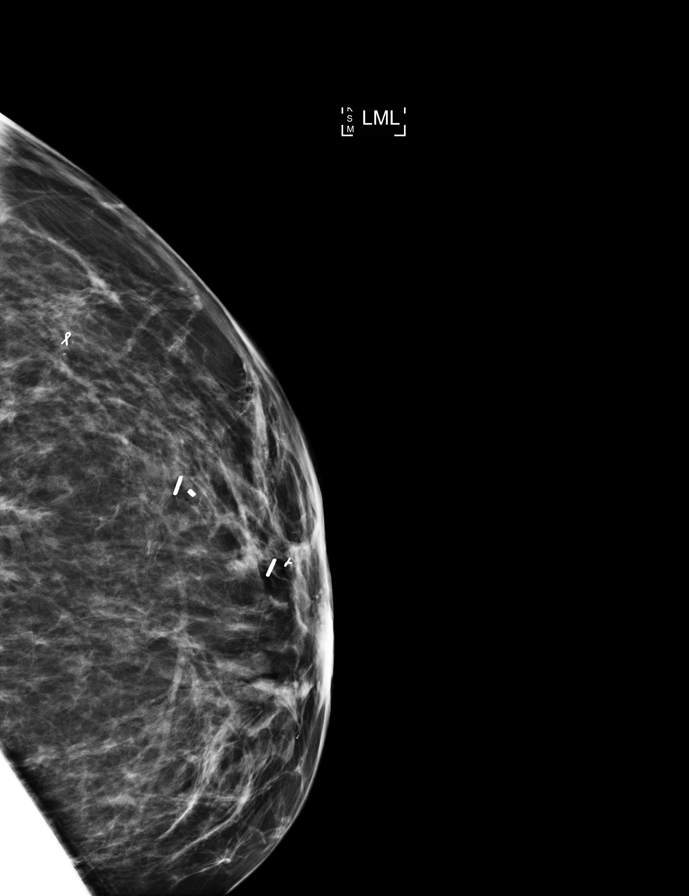

[L LM (1 of 3)]
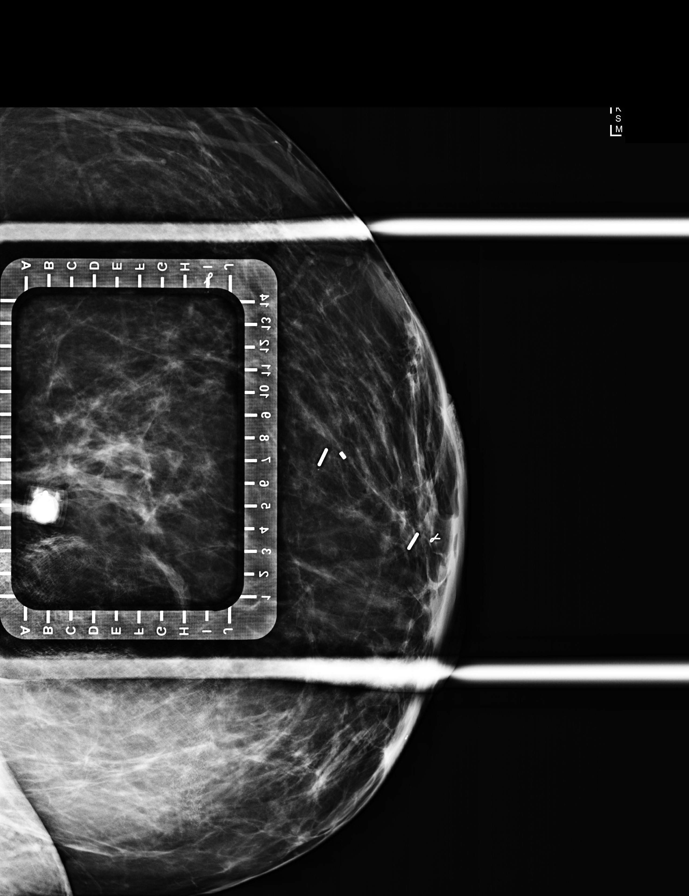

[L CC (2 of 3)]
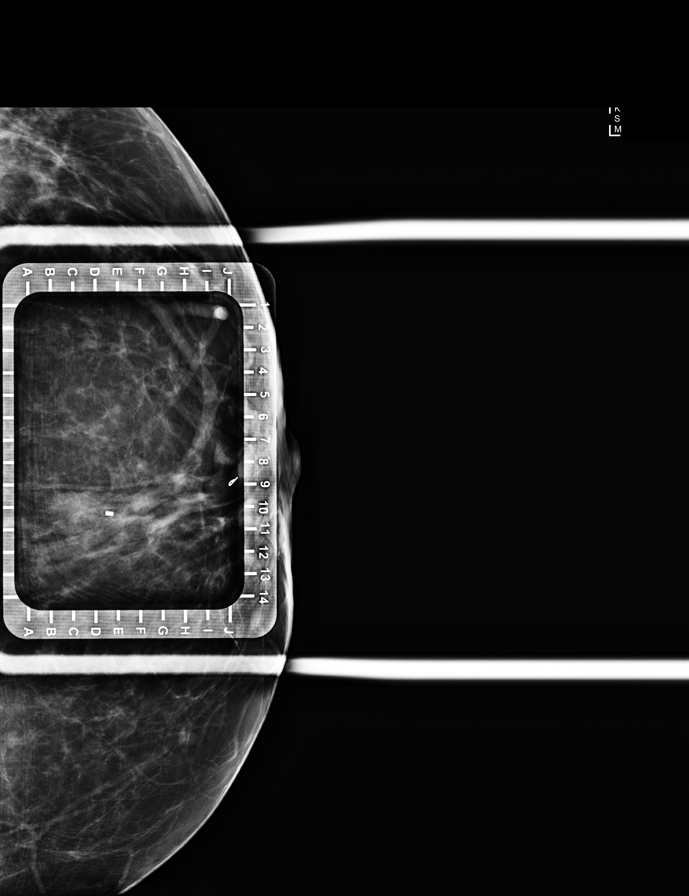

[L LM (2 of 3)]
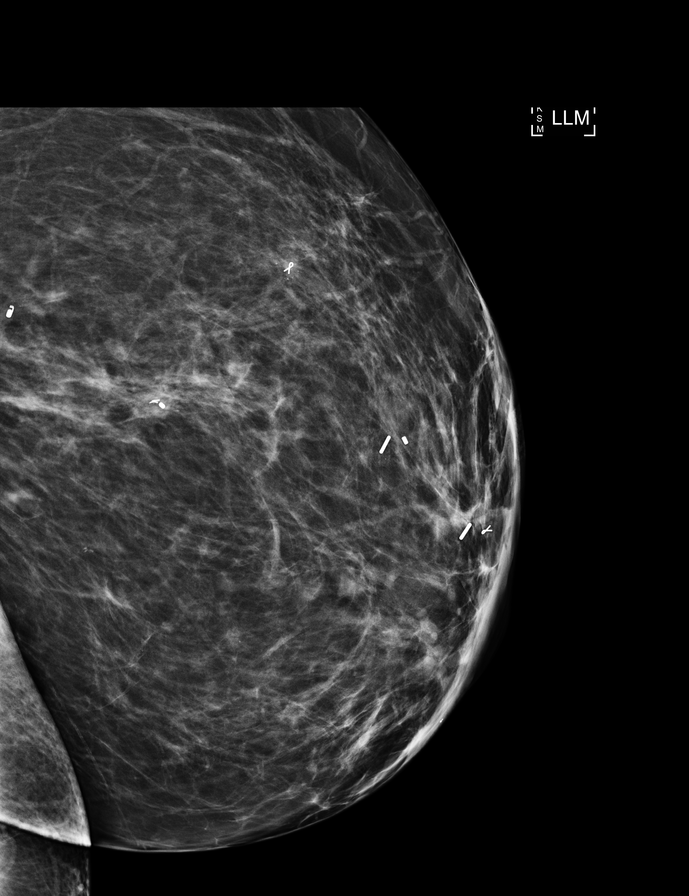

[L LM (3 of 3)]
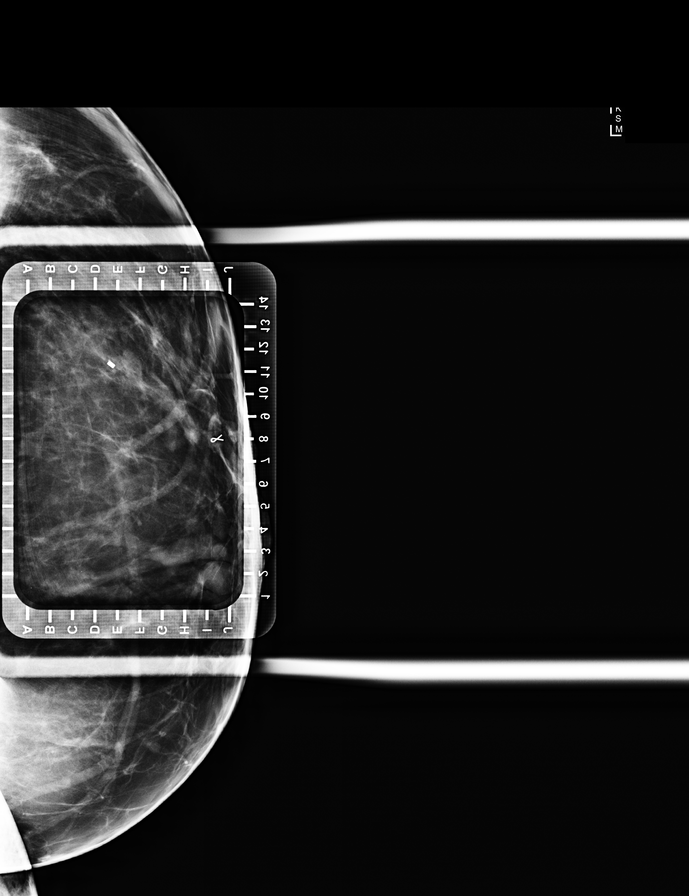

[L CC (3 of 3)]
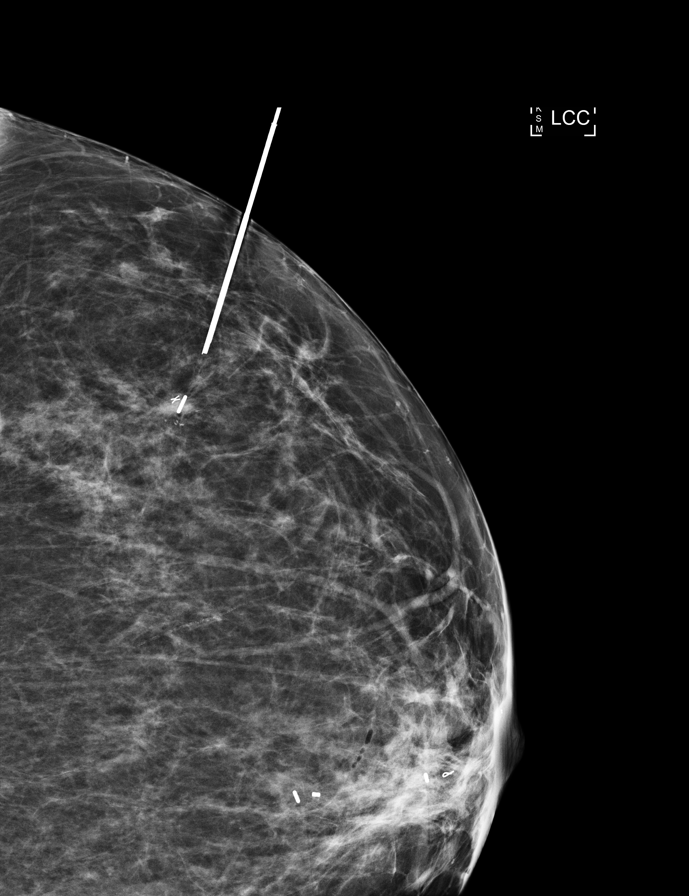

[8 of 12 positions shown; findings below may reference images not displayed]

FINDINGS: Patient presents for radioactive seed localization prior to
excisional biopsy of 3 sites in the left breast. I met with the
patient and we discussed the procedure of seed localization
including benefits and alternatives. We discussed the high
likelihood of a successful procedure. We discussed the risks of the
procedure including infection, bleeding, tissue injury and further
surgery. We discussed the low dose of radioactivity involved in the
procedure. Informed, written consent was given.

The usual time-out protocol was performed immediately prior to the
procedure.

SITE 1:

Using mammographic guidance, sterile technique, 1% lidocaine and an
Y-5SM radioactive seed, the ribbon shaped biopsy marking clip in the
subareolar left breast at 12 o'clock indicating the site of the
papilloma was localized using a superior approach. The follow-up
mammogram images confirm the seed in the expected location and were
marked for Dr. Xaundre.

Follow-up survey of the patient confirms presence of the radioactive
seed.

Order number of Y-5SM seed:  [id number].

Total activity:  0.256 millicuries  Reference Date: 10/18/2016

SITE 2:

Using mammographic guidance, sterile technique, 1% lidocaine and an
Y-5SM radioactive seed, the cylinder shaped biopsy marking clip in
the left breast at approximately [DATE] anterior depth indicating the
site of the second papilloma was localized using a superior
approach. The follow-up mammogram images confirm the seed in the
expected location and were marked for Dr. Xaundre.

Follow-up survey of the patient confirms presence of the radioactive
seed.

Order number of Y-5SM seed:  [id number].

Total activity:  0.249 millicuries  Reference Date: 10/24/2016

SITE 3:

Using mammographic guidance, sterile technique, 1% lidocaine and an
Y-5SM radioactive seed, the X shaped biopsy marking clip in the
upper-outer left breast indicating the site of distortion was
localized using a lateral approach. The follow-up mammogram images
confirm the seed in the expected location and were marked for Dr.
Xaundre.

Follow-up survey of the patient confirms presence of the radioactive
seed.

Order number of Y-5SM seed:  [id number].

Total activity:  0.256 millicuries  Reference Date: 10/18/2016

The patient tolerated the procedure well and was released from the
[REDACTED]. She was given instructions regarding seed removal.
IMPRESSION: Three radioactive seed localizations left breast as described. No
apparent complications.

## 2018-05-15 DIAGNOSIS — H43811 Vitreous degeneration, right eye: Secondary | ICD-10-CM | POA: Diagnosis not present

## 2018-06-12 DIAGNOSIS — M1712 Unilateral primary osteoarthritis, left knee: Secondary | ICD-10-CM | POA: Diagnosis not present

## 2018-06-12 DIAGNOSIS — Z6837 Body mass index (BMI) 37.0-37.9, adult: Secondary | ICD-10-CM | POA: Diagnosis not present

## 2018-06-12 DIAGNOSIS — I10 Essential (primary) hypertension: Secondary | ICD-10-CM | POA: Diagnosis not present

## 2018-07-10 DIAGNOSIS — R05 Cough: Secondary | ICD-10-CM | POA: Diagnosis not present

## 2018-07-10 DIAGNOSIS — J069 Acute upper respiratory infection, unspecified: Secondary | ICD-10-CM | POA: Diagnosis not present

## 2018-07-10 DIAGNOSIS — Z6838 Body mass index (BMI) 38.0-38.9, adult: Secondary | ICD-10-CM | POA: Diagnosis not present

## 2018-10-26 DIAGNOSIS — Z0001 Encounter for general adult medical examination with abnormal findings: Secondary | ICD-10-CM | POA: Diagnosis not present

## 2018-10-31 DIAGNOSIS — R300 Dysuria: Secondary | ICD-10-CM | POA: Diagnosis not present

## 2018-10-31 DIAGNOSIS — M1712 Unilateral primary osteoarthritis, left knee: Secondary | ICD-10-CM | POA: Diagnosis not present

## 2018-10-31 DIAGNOSIS — Z23 Encounter for immunization: Secondary | ICD-10-CM | POA: Diagnosis not present

## 2018-10-31 DIAGNOSIS — Z0001 Encounter for general adult medical examination with abnormal findings: Secondary | ICD-10-CM | POA: Diagnosis not present

## 2018-10-31 DIAGNOSIS — N183 Chronic kidney disease, stage 3 (moderate): Secondary | ICD-10-CM | POA: Diagnosis not present

## 2018-10-31 DIAGNOSIS — Z1212 Encounter for screening for malignant neoplasm of rectum: Secondary | ICD-10-CM | POA: Diagnosis not present

## 2018-10-31 DIAGNOSIS — I10 Essential (primary) hypertension: Secondary | ICD-10-CM | POA: Diagnosis not present

## 2018-12-19 DIAGNOSIS — N6325 Unspecified lump in the left breast, overlapping quadrants: Secondary | ICD-10-CM | POA: Diagnosis not present

## 2018-12-19 DIAGNOSIS — N6315 Unspecified lump in the right breast, overlapping quadrants: Secondary | ICD-10-CM | POA: Diagnosis not present

## 2018-12-19 DIAGNOSIS — R928 Other abnormal and inconclusive findings on diagnostic imaging of breast: Secondary | ICD-10-CM | POA: Diagnosis not present

## 2019-04-29 DIAGNOSIS — I10 Essential (primary) hypertension: Secondary | ICD-10-CM | POA: Diagnosis not present

## 2019-04-29 DIAGNOSIS — N950 Postmenopausal bleeding: Secondary | ICD-10-CM | POA: Diagnosis not present

## 2019-05-07 DIAGNOSIS — Z1212 Encounter for screening for malignant neoplasm of rectum: Secondary | ICD-10-CM | POA: Diagnosis not present

## 2019-05-07 DIAGNOSIS — M1712 Unilateral primary osteoarthritis, left knee: Secondary | ICD-10-CM | POA: Diagnosis not present

## 2019-05-07 DIAGNOSIS — I10 Essential (primary) hypertension: Secondary | ICD-10-CM | POA: Diagnosis not present

## 2020-08-06 ENCOUNTER — Other Ambulatory Visit: Payer: Self-pay

## 2020-08-06 ENCOUNTER — Other Ambulatory Visit (HOSPITAL_COMMUNITY): Payer: Self-pay | Admitting: Family Medicine

## 2020-08-06 ENCOUNTER — Ambulatory Visit (HOSPITAL_COMMUNITY)
Admission: RE | Admit: 2020-08-06 | Discharge: 2020-08-06 | Disposition: A | Discharge status: Home / Self Care | Payer: BC Managed Care – PPO | Source: Ambulatory Visit | Attending: Family Medicine | Admitting: Family Medicine

## 2020-08-06 ENCOUNTER — Other Ambulatory Visit: Payer: Self-pay | Admitting: Family Medicine

## 2020-08-06 DIAGNOSIS — M79604 Pain in right leg: Secondary | ICD-10-CM | POA: Insufficient documentation

## 2020-08-06 DIAGNOSIS — R609 Edema, unspecified: Secondary | ICD-10-CM

## 2020-08-06 DIAGNOSIS — M79605 Pain in left leg: Secondary | ICD-10-CM | POA: Diagnosis not present

## 2023-05-23 DIAGNOSIS — R5383 Other fatigue: Secondary | ICD-10-CM | POA: Diagnosis not present

## 2023-05-23 DIAGNOSIS — N1831 Chronic kidney disease, stage 3a: Secondary | ICD-10-CM | POA: Diagnosis not present

## 2023-05-23 DIAGNOSIS — E039 Hypothyroidism, unspecified: Secondary | ICD-10-CM | POA: Diagnosis not present

## 2023-05-23 DIAGNOSIS — Z0001 Encounter for general adult medical examination with abnormal findings: Secondary | ICD-10-CM | POA: Diagnosis not present

## 2023-05-23 DIAGNOSIS — E559 Vitamin D deficiency, unspecified: Secondary | ICD-10-CM | POA: Diagnosis not present

## 2023-05-23 DIAGNOSIS — E7849 Other hyperlipidemia: Secondary | ICD-10-CM | POA: Diagnosis not present

## 2023-05-26 DIAGNOSIS — N1831 Chronic kidney disease, stage 3a: Secondary | ICD-10-CM | POA: Diagnosis not present

## 2023-05-26 DIAGNOSIS — Z1389 Encounter for screening for other disorder: Secondary | ICD-10-CM | POA: Diagnosis not present

## 2023-05-26 DIAGNOSIS — Z23 Encounter for immunization: Secondary | ICD-10-CM | POA: Diagnosis not present

## 2023-05-26 DIAGNOSIS — Z0001 Encounter for general adult medical examination with abnormal findings: Secondary | ICD-10-CM | POA: Diagnosis not present

## 2023-05-26 DIAGNOSIS — M1712 Unilateral primary osteoarthritis, left knee: Secondary | ICD-10-CM | POA: Diagnosis not present

## 2023-05-26 DIAGNOSIS — I10 Essential (primary) hypertension: Secondary | ICD-10-CM | POA: Diagnosis not present

## 2023-07-05 DIAGNOSIS — Z1231 Encounter for screening mammogram for malignant neoplasm of breast: Secondary | ICD-10-CM | POA: Diagnosis not present

## 2023-10-03 DIAGNOSIS — R197 Diarrhea, unspecified: Secondary | ICD-10-CM | POA: Diagnosis not present

## 2023-10-12 ENCOUNTER — Encounter (INDEPENDENT_AMBULATORY_CARE_PROVIDER_SITE_OTHER): Payer: Self-pay | Admitting: *Deleted

## 2023-11-06 ENCOUNTER — Encounter (INDEPENDENT_AMBULATORY_CARE_PROVIDER_SITE_OTHER): Payer: Self-pay | Admitting: Gastroenterology

## 2023-11-06 ENCOUNTER — Ambulatory Visit (INDEPENDENT_AMBULATORY_CARE_PROVIDER_SITE_OTHER): Admitting: Gastroenterology

## 2023-11-06 VITALS — BP 128/77 | HR 82 | Temp 97.8°F | Ht 63.0 in | Wt 242.2 lb

## 2023-11-06 DIAGNOSIS — K529 Noninfective gastroenteritis and colitis, unspecified: Secondary | ICD-10-CM | POA: Insufficient documentation

## 2023-11-06 DIAGNOSIS — R197 Diarrhea, unspecified: Secondary | ICD-10-CM

## 2023-11-06 NOTE — H&P (View-Only) (Signed)
 Caitlyn Cabrera, M.D. Gastroenterology & Hepatology [facility] [address] [place], [place] [address] Primary Care Physician: Leesa Pulling, MD [address] [place] [address]  Referring MD: PCP  Chief Complaint:  diarrhea  History of Present Illness: Caitlyn Cabrera is a 68 y.o. female with PMH HTN, who presents for evaluation of diarrhea.  Patient reports that in January 2025 she started having frequent episodes of frequent watery bowel movements. She noticed having up to 12 bowel movements per day. No melena or hematochezia. She also presenting lower abdominal pain, which was intermittent. Prior to this, she used to have 1 BM per day.  The patient denies having any nausea, vomiting, fever, chills, hematochezia, melena, hematemesis,   jaundice, pruritus. She has gained possibly 10 lb since January.  Patient reports that she has been under significant stress after her son passed away in a car accident in January 11/2023. She states that she has been very nervous and this may have led to her symptoms.  Labs received from referring office, which showed CBC with WBC 8.6, hemoglobin 13.6, platelets 305, CMP with normal electrolytes, AST 10, ALT 7.0, alkaline phosphatase 57, albumin 4.2, negative alpha gal panel, negative transglutaminase and endomysial antibodies with normal IgA of 204.  States that PCP started her on Imodium twice a day and has lead to 1 BM per day.  Last UJW:JXBJY Last Colonoscopy: 20 years ago at [facility], reports that this was normal, but no reports available. She reports having a normal Cologuard 5 years ago.  FHx: neg for any gastrointestinal/liver disease, aunt had breast cancer Social: neg smoking, alcohol or illicit drug use Surgical: tubal ligation  Past Medical History: Past Medical History:  Diagnosis Date   Arthritis    lt knee   Breast mass, left    Headache    Hypertension     Past Surgical History: Past  Surgical History:  Procedure Laterality Date   APPENDECTOMY     BREAST LUMPECTOMY WITH RADIOACTIVE SEED LOCALIZATION Left 10/27/2016   Procedure: LEFT BREAST LUMPECTOMY WITH RADIOACTIVE SEED LOCALIZATION;  Surgeon: Dareen Ebbing, MD;  Location: [facility];  Service: General;  Laterality: Left;   CHOLECYSTECTOMY     TONSILLECTOMY     TUBAL LIGATION      Family History:History reviewed. No pertinent family history.  Social History: Social History   Tobacco Use  Smoking Status Never  Smokeless Tobacco Never   Social History   Substance and Sexual Activity  Alcohol Use No   Social History   Substance and Sexual Activity  Drug Use No    Allergies: No Known Allergies  Medications: Current Outpatient Medications  Medication Sig Dispense Refill   acetaminophen  (TYLENOL ) 650 MG CR tablet Take 650 mg by mouth every 8 (eight) hours as needed for pain.     HYDROcodone -acetaminophen  (NORCO/VICODIN) 5-325 MG tablet Take 1-2 tablets by mouth every 6 (six) hours as needed for moderate pain. 20 tablet 0   lisinopril-hydrochlorothiazide (PRINZIDE,ZESTORETIC) 10-12.5 MG tablet Take 1 tablet by mouth daily.     OVER THE COUNTER MEDICATION Bio freeze prn left knee     Vitamin D, Cholecalciferol, 1000 units TABS Take by mouth daily at 6 (six) AM.     No current facility-administered medications for this visit.    Review of Systems: GENERAL: negative for malaise, night sweats HEENT: No changes in hearing or vision, no nose bleeds or other nasal problems. NECK: Negative for lumps, goiter, pain and  significant neck swelling RESPIRATORY: Negative for cough, wheezing CARDIOVASCULAR: Negative for chest pain, leg swelling, palpitations, orthopnea GI: SEE HPI MUSCULOSKELETAL: Negative for joint pain or swelling, back pain, and muscle pain. SKIN: Negative for lesions, rash PSYCH: Negative for sleep disturbance, mood disorder and recent psychosocial stressors. HEMATOLOGY  Negative for prolonged bleeding, bruising easily, and swollen nodes. ENDOCRINE: Negative for cold or heat intolerance, polyuria, polydipsia and goiter. NEURO: negative for tremor, gait imbalance, syncope and seizures. The remainder of the review of systems is noncontributory.   Physical Exam: BP 128/77 (BP Location: Left Arm, Patient Position: Sitting, Cuff Size: Large)   Pulse 82   Temp 97.8 F (36.6 C) (Temporal)   Ht 5\' 3"  (1.6 m)   Wt 242 lb 3.2 oz (109.9 kg)   BMI 42.90 kg/m  GENERAL: The patient is AO x3, in no acute distress. HEENT: Head is normocephalic and atraumatic. EOMI are intact. Mouth is well hydrated and without lesions. NECK: Supple. No masses LUNGS: Clear to auscultation. No presence of rhonchi/wheezing/rales. Adequate chest expansion HEART: RRR, normal s1 and s2. ABDOMEN: Soft, nontender, no guarding, no peritoneal signs, and nondistended. BS +. No masses. EXTREMITIES: Without any cyanosis, clubbing, rash, lesions or edema. NEUROLOGIC: AOx3, no focal motor deficit. SKIN: no jaundice, no rashes   Imaging/Labs: as above  I personally reviewed and interpreted the available labs, imaging and endoscopic files.  Impression and Plan: Caitlyn Cabrera is a 68 y.o. female with PMH HTN, who presents for evaluation of diarrhea. Patient has had recurrent episodes of diarrhea after recent stressful event. Has had multiple investigations that have ruled out metabolic and autoimmune disorders. She has recently responded to the use of Imodium BID. It is very likely her symptoms are related to a functional etiology, but will proceed with colonoscopy to r/o inflammatory causes, including microscopic colitis.  -Schedule colonoscopy with random colonic biopsies - Continue Imodium twice a day for now.  Should stop taking this medication a week before your colonoscopy.  All questions were answered.      Caitlyn Cress, MD Gastroenterology and Hepatology [facility]  Gastroenterology

## 2023-11-06 NOTE — Progress Notes (Signed)
 Samantha Cress, M.D. Gastroenterology & Hepatology [facility] [address] [place], [place] [address] Primary Care Physician: Leesa Pulling, MD [address] [place] [address]  Referring MD: PCP  Chief Complaint:  diarrhea  History of Present Illness: Caitlyn Cabrera is a 68 y.o. female with PMH HTN, who presents for evaluation of diarrhea.  Patient reports that in January 2025 she started having frequent episodes of frequent watery bowel movements. She noticed having up to 12 bowel movements per day. No melena or hematochezia. She also presenting lower abdominal pain, which was intermittent. Prior to this, she used to have 1 BM per day.  The patient denies having any nausea, vomiting, fever, chills, hematochezia, melena, hematemesis,   jaundice, pruritus. She has gained possibly 10 lb since January.  Patient reports that she has been under significant stress after her son passed away in a car accident in January 11/2023. She states that she has been very nervous and this may have led to her symptoms.  Labs received from referring office, which showed CBC with WBC 8.6, hemoglobin 13.6, platelets 305, CMP with normal electrolytes, AST 10, ALT 7.0, alkaline phosphatase 57, albumin 4.2, negative alpha gal panel, negative transglutaminase and endomysial antibodies with normal IgA of 204.  States that PCP started her on Imodium twice a day and has lead to 1 BM per day.  Last UJW:JXBJY Last Colonoscopy: 20 years ago at [facility], reports that this was normal, but no reports available. She reports having a normal Cologuard 5 years ago.  FHx: neg for any gastrointestinal/liver disease, aunt had breast cancer Social: neg smoking, alcohol or illicit drug use Surgical: tubal ligation  Past Medical History: Past Medical History:  Diagnosis Date   Arthritis    lt knee   Breast mass, left    Headache    Hypertension     Past Surgical History: Past  Surgical History:  Procedure Laterality Date   APPENDECTOMY     BREAST LUMPECTOMY WITH RADIOACTIVE SEED LOCALIZATION Left 10/27/2016   Procedure: LEFT BREAST LUMPECTOMY WITH RADIOACTIVE SEED LOCALIZATION;  Surgeon: Dareen Ebbing, MD;  Location: [facility];  Service: General;  Laterality: Left;   CHOLECYSTECTOMY     TONSILLECTOMY     TUBAL LIGATION      Family History:History reviewed. No pertinent family history.  Social History: Social History   Tobacco Use  Smoking Status Never  Smokeless Tobacco Never   Social History   Substance and Sexual Activity  Alcohol Use No   Social History   Substance and Sexual Activity  Drug Use No    Allergies: No Known Allergies  Medications: Current Outpatient Medications  Medication Sig Dispense Refill   acetaminophen  (TYLENOL ) 650 MG CR tablet Take 650 mg by mouth every 8 (eight) hours as needed for pain.     HYDROcodone -acetaminophen  (NORCO/VICODIN) 5-325 MG tablet Take 1-2 tablets by mouth every 6 (six) hours as needed for moderate pain. 20 tablet 0   lisinopril-hydrochlorothiazide (PRINZIDE,ZESTORETIC) 10-12.5 MG tablet Take 1 tablet by mouth daily.     OVER THE COUNTER MEDICATION Bio freeze prn left knee     Vitamin D, Cholecalciferol, 1000 units TABS Take by mouth daily at 6 (six) AM.     No current facility-administered medications for this visit.    Review of Systems: GENERAL: negative for malaise, night sweats HEENT: No changes in hearing or vision, no nose bleeds or other nasal problems. NECK: Negative for lumps, goiter, pain and  significant neck swelling RESPIRATORY: Negative for cough, wheezing CARDIOVASCULAR: Negative for chest pain, leg swelling, palpitations, orthopnea GI: SEE HPI MUSCULOSKELETAL: Negative for joint pain or swelling, back pain, and muscle pain. SKIN: Negative for lesions, rash PSYCH: Negative for sleep disturbance, mood disorder and recent psychosocial stressors. HEMATOLOGY  Negative for prolonged bleeding, bruising easily, and swollen nodes. ENDOCRINE: Negative for cold or heat intolerance, polyuria, polydipsia and goiter. NEURO: negative for tremor, gait imbalance, syncope and seizures. The remainder of the review of systems is noncontributory.   Physical Exam: BP 128/77 (BP Location: Left Arm, Patient Position: Sitting, Cuff Size: Large)   Pulse 82   Temp 97.8 F (36.6 C) (Temporal)   Ht 5\' 3"  (1.6 m)   Wt 242 lb 3.2 oz (109.9 kg)   BMI 42.90 kg/m  GENERAL: The patient is AO x3, in no acute distress. HEENT: Head is normocephalic and atraumatic. EOMI are intact. Mouth is well hydrated and without lesions. NECK: Supple. No masses LUNGS: Clear to auscultation. No presence of rhonchi/wheezing/rales. Adequate chest expansion HEART: RRR, normal s1 and s2. ABDOMEN: Soft, nontender, no guarding, no peritoneal signs, and nondistended. BS +. No masses. EXTREMITIES: Without any cyanosis, clubbing, rash, lesions or edema. NEUROLOGIC: AOx3, no focal motor deficit. SKIN: no jaundice, no rashes   Imaging/Labs: as above  I personally reviewed and interpreted the available labs, imaging and endoscopic files.  Impression and Plan: Caitlyn Cabrera is a 68 y.o. female with PMH HTN, who presents for evaluation of diarrhea. Patient has had recurrent episodes of diarrhea after recent stressful event. Has had multiple investigations that have ruled out metabolic and autoimmune disorders. She has recently responded to the use of Imodium BID. It is very likely her symptoms are related to a functional etiology, but will proceed with colonoscopy to r/o inflammatory causes, including microscopic colitis.  -Schedule colonoscopy with random colonic biopsies - Continue Imodium twice a day for now.  Should stop taking this medication a week before your colonoscopy.  All questions were answered.      Samantha Cress, MD Gastroenterology and Hepatology [facility]  Gastroenterology

## 2023-11-06 NOTE — Patient Instructions (Signed)
 Schedule colonoscopy Continue Imodium twice a day for now.  Please stop taking this medication a week before your colonoscopy.

## 2023-11-07 ENCOUNTER — Encounter: Payer: Self-pay | Admitting: *Deleted

## 2023-11-07 ENCOUNTER — Other Ambulatory Visit: Payer: Self-pay | Admitting: *Deleted

## 2023-11-07 MED ORDER — PEG 3350-KCL-NA BICARB-NACL 420 G PO SOLR: 4000.0000 mL | Freq: Once | ORAL | 0 refills | Status: AC

## 2023-11-20 DIAGNOSIS — R5383 Other fatigue: Secondary | ICD-10-CM | POA: Diagnosis not present

## 2023-11-20 DIAGNOSIS — N1832 Chronic kidney disease, stage 3b: Secondary | ICD-10-CM | POA: Diagnosis not present

## 2023-11-20 DIAGNOSIS — I10 Essential (primary) hypertension: Secondary | ICD-10-CM | POA: Diagnosis not present

## 2023-11-23 NOTE — Patient Instructions (Signed)
 Caitlyn Cabrera  11/23/2023     @PREFPERIOPPHARMACY @   Your procedure is scheduled on  11/28/2023.   Report to [facility] at  0800 A.M.   Call this number if you have problems the morning of surgery:  [phone]  If you experience any cold or flu symptoms such as cough, fever, chills, shortness of breath, etc. between now and your scheduled surgery, please notify us  at the above number.   Remember:  Follow the diet and prep instructions given to you by the office.   You may drink clear liquids until 0600 am on 11/28/2023.    Clear liquids allowed are:                    Water, Juice (No red color; non-citric and without pulp; diabetics please choose diet or no sugar options), Carbonated beverages (diabetics please choose diet or no sugar options), Clear Tea (No creamer, milk, or cream, including half & half and powdered creamer), Black Coffee Only (No creamer, milk or cream, including half & half and powdered creamer), and Clear Sports drink (No red color; diabetics please choose diet or no sugar options)    Take these medicines the morning of surgery with A SIP OF WATER                                      hydrocodone  (if needed).    Do not wear jewelry, make-up or nail polish, including gel polish,  artificial nails, or any other type of covering on natural nails (fingers and  toes).  Do not wear lotions, powders, or perfumes, or deodorant.  Do not shave 48 hours prior to surgery.  Men may shave face and neck.  Do not bring valuables to the hospital.  [facility] is not responsible for any belongings or valuables.  Contacts, dentures or bridgework may not be worn into surgery.  Leave your suitcase in the car.  After surgery it may be brought to your room.  For patients admitted to the hospital, discharge time will be determined by your treatment team.  Patients discharged the day of surgery will not be allowed to drive home and must have someone with them for 24 hours.     Special instructions:   DO NOT smoke tobacco or vape for 24 hours before your procedure.  Please read over the following fact sheets that you were given. Anesthesia Post-op Instructions and Care and Recovery After Surgery      Colonoscopy, Adult, Care After The following information offers guidance on how to care for yourself after your procedure. Your health care provider may also give you more specific instructions. If you have problems or questions, contact your health care provider. What can I expect after the procedure? After the procedure, it is common to have: A small amount of blood in your stool for 24 hours after the procedure. Some gas. Mild cramping or bloating of your abdomen. Follow these instructions at home: Eating and drinking  Drink enough fluid to keep your urine pale yellow. Follow instructions from your health care provider about eating or drinking restrictions. Resume your normal diet as told by your health care provider. Avoid heavy or fried foods that are hard to digest. Activity Rest as told by your health care provider. Avoid sitting for a long time without moving. Get up to take short walks every  1-2 hours. This is important to improve blood flow and breathing. Ask for help if you feel weak or unsteady. Return to your normal activities as told by your health care provider. Ask your health care provider what activities are safe for you. Managing cramping and bloating  Try walking around when you have cramps or feel bloated. If directed, apply heat to your abdomen as told by your health care provider. Use the heat source that your health care provider recommends, such as a moist heat pack or a heating pad. Place a towel between your skin and the heat source. Leave the heat on for 20-30 minutes. Remove the heat if your skin turns bright red. This is especially important if you are unable to feel pain, heat, or cold. You have a greater risk of getting  burned. General instructions If you were given a sedative during the procedure, it can affect you for several hours. Do not drive or operate machinery until your health care provider says that it is safe. For the first 24 hours after the procedure: Do not sign important documents. Do not drink alcohol. Do your regular daily activities at a slower pace than normal. Eat soft foods that are easy to digest. Take over-the-counter and prescription medicines only as told by your health care provider. Keep all follow-up visits. This is important. Contact a health care provider if: You have blood in your stool 2-3 days after the procedure. Get help right away if: You have more than a small spotting of blood in your stool. You have large blood clots in your stool. You have swelling of your abdomen. You have nausea or vomiting. You have a fever. You have increasing pain in your abdomen that is not relieved with medicine. These symptoms may be an emergency. Get help right away. Call 911. Do not wait to see if the symptoms will go away. Do not drive yourself to the hospital. Summary After the procedure, it is common to have a small amount of blood in your stool. You may also have mild cramping and bloating of your abdomen. If you were given a sedative during the procedure, it can affect you for several hours. Do not drive or operate machinery until your health care provider says that it is safe. Get help right away if you have a lot of blood in your stool, nausea or vomiting, a fever, or increased pain in your abdomen. This information is not intended to replace advice given to you by your health care provider. Make sure you discuss any questions you have with your health care provider. Document Revised: 07/05/2022 Document Reviewed: 01/13/2021 Elsevier Patient Education  2024 Elsevier Inc.General Anesthesia, Adult, Care After The following information offers guidance on how to care for yourself  after your procedure. Your health care provider may also give you more specific instructions. If you have problems or questions, contact your health care provider. What can I expect after the procedure? After the procedure, it is common for people to: Have pain or discomfort at the IV site. Have nausea or vomiting. Have a sore throat or hoarseness. Have trouble concentrating. Feel cold or chills. Feel weak, sleepy, or tired (fatigue). Have soreness and body aches. These can affect parts of the body that were not involved in surgery. Follow these instructions at home: For the time period you were told by your health care provider:  Rest. Do not participate in activities where you could fall or become injured. Do not drive or use  machinery. Do not drink alcohol. Do not take sleeping pills or medicines that cause drowsiness. Do not make important decisions or sign legal documents. Do not take care of children on your own. General instructions Drink enough fluid to keep your urine pale yellow. If you have sleep apnea, surgery and certain medicines can increase your risk for breathing problems. Follow instructions from your health care provider about wearing your sleep device: Anytime you are sleeping, including during daytime naps. While taking prescription pain medicines, sleeping medicines, or medicines that make you drowsy. Return to your normal activities as told by your health care provider. Ask your health care provider what activities are safe for you. Take over-the-counter and prescription medicines only as told by your health care provider. Do not use any products that contain nicotine or tobacco. These products include cigarettes, chewing tobacco, and vaping devices, such as e-cigarettes. These can delay incision healing after surgery. If you need help quitting, ask your health care provider. Contact a health care provider if: You have nausea or vomiting that does not get better  with medicine. You vomit every time you eat or drink. You have pain that does not get better with medicine. You cannot urinate or have bloody urine. You develop a skin rash. You have a fever. Get help right away if: You have trouble breathing. You have chest pain. You vomit blood. These symptoms may be an emergency. Get help right away. Call 911. Do not wait to see if the symptoms will go away. Do not drive yourself to the hospital. Summary After the procedure, it is common to have a sore throat, hoarseness, nausea, vomiting, or to feel weak, sleepy, or fatigue. For the time period you were told by your health care provider, do not drive or use machinery. Get help right away if you have difficulty breathing, have chest pain, or vomit blood. These symptoms may be an emergency. This information is not intended to replace advice given to you by your health care provider. Make sure you discuss any questions you have with your health care provider. Document Revised: 08/20/2021 Document Reviewed: 08/20/2021 Elsevier Patient Education  2024 [employer].

## 2023-11-24 ENCOUNTER — Encounter (HOSPITAL_COMMUNITY): Payer: Self-pay

## 2023-11-24 ENCOUNTER — Encounter (HOSPITAL_COMMUNITY)
Admission: RE | Admit: 2023-11-24 | Discharge: 2023-11-24 | Disposition: A | Discharge status: Home / Self Care | Source: Ambulatory Visit | Attending: Gastroenterology | Admitting: Gastroenterology

## 2023-11-24 ENCOUNTER — Ambulatory Visit (INDEPENDENT_AMBULATORY_CARE_PROVIDER_SITE_OTHER): Payer: Self-pay | Admitting: Gastroenterology

## 2023-11-24 VITALS — BP 143/83 | HR 86 | Resp 18 | Ht 63.0 in | Wt 242.2 lb

## 2023-11-24 DIAGNOSIS — I10 Essential (primary) hypertension: Secondary | ICD-10-CM | POA: Insufficient documentation

## 2023-11-24 DIAGNOSIS — I493 Ventricular premature depolarization: Secondary | ICD-10-CM | POA: Diagnosis not present

## 2023-11-24 DIAGNOSIS — Z01810 Encounter for preprocedural cardiovascular examination: Secondary | ICD-10-CM | POA: Insufficient documentation

## 2023-11-24 DIAGNOSIS — I452 Bifascicular block: Secondary | ICD-10-CM | POA: Diagnosis not present

## 2023-11-24 DIAGNOSIS — Z79899 Other long term (current) drug therapy: Secondary | ICD-10-CM

## 2023-11-27 DIAGNOSIS — R197 Diarrhea, unspecified: Secondary | ICD-10-CM | POA: Diagnosis not present

## 2023-11-27 DIAGNOSIS — I10 Essential (primary) hypertension: Secondary | ICD-10-CM | POA: Diagnosis not present

## 2023-11-27 DIAGNOSIS — N1831 Chronic kidney disease, stage 3a: Secondary | ICD-10-CM | POA: Diagnosis not present

## 2023-11-28 ENCOUNTER — Encounter (HOSPITAL_COMMUNITY)
Admission: RE | Disposition: A | Discharge status: Home / Self Care | Payer: Self-pay | Source: Home / Self Care | Attending: Gastroenterology

## 2023-11-28 ENCOUNTER — Ambulatory Visit (HOSPITAL_COMMUNITY): Admitting: Anesthesiology

## 2023-11-28 ENCOUNTER — Encounter (HOSPITAL_COMMUNITY): Payer: Self-pay | Admitting: Gastroenterology

## 2023-11-28 ENCOUNTER — Ambulatory Visit (HOSPITAL_COMMUNITY)
Admission: RE | Admit: 2023-11-28 | Discharge: 2023-11-28 | Disposition: A | Discharge status: Home / Self Care | Attending: Gastroenterology | Admitting: Gastroenterology

## 2023-11-28 DIAGNOSIS — K635 Polyp of colon: Secondary | ICD-10-CM

## 2023-11-28 DIAGNOSIS — D122 Benign neoplasm of ascending colon: Secondary | ICD-10-CM | POA: Insufficient documentation

## 2023-11-28 DIAGNOSIS — R197 Diarrhea, unspecified: Secondary | ICD-10-CM | POA: Insufficient documentation

## 2023-11-28 DIAGNOSIS — E6689 Other obesity not elsewhere classified: Secondary | ICD-10-CM | POA: Insufficient documentation

## 2023-11-28 DIAGNOSIS — K562 Volvulus: Secondary | ICD-10-CM | POA: Diagnosis not present

## 2023-11-28 DIAGNOSIS — Z6841 Body Mass Index (BMI) 40.0 and over, adult: Secondary | ICD-10-CM | POA: Diagnosis not present

## 2023-11-28 DIAGNOSIS — I10 Essential (primary) hypertension: Secondary | ICD-10-CM | POA: Diagnosis not present

## 2023-11-28 DIAGNOSIS — D123 Benign neoplasm of transverse colon: Secondary | ICD-10-CM | POA: Diagnosis not present

## 2023-11-28 DIAGNOSIS — K5730 Diverticulosis of large intestine without perforation or abscess without bleeding: Secondary | ICD-10-CM | POA: Insufficient documentation

## 2023-11-28 DIAGNOSIS — K648 Other hemorrhoids: Secondary | ICD-10-CM | POA: Insufficient documentation

## 2023-11-28 HISTORY — PX: COLONOSCOPY: SHX5424

## 2023-11-28 LAB — HM COLONOSCOPY

## 2023-11-28 SURGERY — COLONOSCOPY
Anesthesia: General

## 2023-11-28 MED ORDER — LACTATED RINGERS IV SOLN
INTRAVENOUS | Status: DC | PRN
Start: 2023-11-28 — End: 2023-11-28

## 2023-11-28 MED ORDER — PROPOFOL 10 MG/ML IV BOLUS
INTRAVENOUS | Status: DC | PRN
  Administered 2023-11-28: 50 mg via INTRAVENOUS
  Administered 2023-11-28: 100 mg via INTRAVENOUS
  Administered 2023-11-28: 50 mg via INTRAVENOUS

## 2023-11-28 MED ORDER — LACTATED RINGERS IV SOLN: INTRAVENOUS | Status: DC

## 2023-11-28 MED ORDER — PROPOFOL 500 MG/50ML IV EMUL
INTRAVENOUS | Status: DC | PRN
  Administered 2023-11-28: 200 ug/kg/min via INTRAVENOUS

## 2023-11-28 MED ORDER — STERILE WATER FOR IRRIGATION IR SOLN
Status: DC | PRN
  Administered 2023-11-28: 50 mL

## 2023-11-28 NOTE — Op Note (Addendum)
 [name] Procedure Date: 11/28/2023 9:24 AM MRN: [id number] Date of Birth: 05-Nov-1955 Attending MD: Toribio Fortune , , [id number] CSN: [id number] Age: 68 Admit Type: Outpatient Procedure:                Colonoscopy Indications:              Clinically significant diarrhea of unexplained                            origin Providers:                Toribio Fortune, Olam Ada, RN, Jon Loge Referring MD:              Medicines:                Monitored Anesthesia Care Complications:            No immediate complications. Estimated Blood Loss:     Estimated blood loss: none. Procedure:                Pre-Anesthesia Assessment:                           - Prior to the procedure, a History and Physical                            was performed, and patient medications, allergies                            and sensitivities were reviewed. The patient's                            tolerance of previous anesthesia was reviewed.                           - The risks and benefits of the procedure and the                            sedation options and risks were discussed with the                            patient. All questions were answered and informed                            consent was obtained.                           - ASA Grade Assessment: II - A patient with mild                            systemic disease.                           After obtaining informed consent, the colonoscope                            was passed under direct vision. Throughout the  procedure, the patient's blood pressure, pulse, and                            oxygen saturations were monitored continuously. The                            [id number]-HQ190L ([id number]) scope was introduced through                            the anus and advanced to the the cecum, identified                            by appendiceal orifice and ileocecal valve. The                             colonoscopy was technically difficult and complex                            due to a redundant colon. Successful completion of                            the procedure was aided by applying abdominal                            pressure. Scope In: 9:39:56 AM Scope Out: 10:16:42 AM Scope Withdrawal Time: 0 hours 23 minutes 2 seconds  Total Procedure Duration: 0 hours 36 minutes 46 seconds  Findings:      The perianal and digital rectal examinations were normal.      The ascending colon revealed significantly excessive looping. 4-hand       abdominal pressure was applied in the RUQ area and LLQ area to       effectively advance the scope.      A 14 mm polyp was found in the proximal ascending colon. The polyp was       sessile. The polyp was removed with a piecemeal technique using a cold       snare. Resection and retrieval were complete.      Three sessile polyps were found in the ascending colon. The polyps were       4 to 8 mm in size. These polyps were removed with a cold snare.       Resection and retrieval were complete.      A 10 mm polyp was found in the transverse colon. The polyp was       semi-sessile. The polyp was removed with a cold snare. Resection and       retrieval were complete.      Scattered small-mouthed diverticula were found in the sigmoid colon.       Biopsies for histology were taken with a cold forceps from the right       colon and left colon for evaluation of microscopic colitis.      Non-bleeding internal hemorrhoids were found during retroflexion. The       hemorrhoids were small. Impression:               - There was significant looping of the colon.                           -  One 14 mm polyp in the proximal ascending colon,                            removed piecemeal using a cold snare. Resected and                            retrieved.                           - Three 4 to 8 mm polyps in the ascending colon,                             removed with a cold snare. Resected and retrieved.                           - One 10 mm polyp in the transverse colon, removed                            with a cold snare. Resected and retrieved.                           - Diverticulosis in the sigmoid colon. Biopsied.                           - Non-bleeding internal hemorrhoids. Moderate Sedation:      Per Anesthesia Care Recommendation:           - Discharge patient to home (ambulatory).                           - Resume previous diet.                           - Await pathology results.                           - Repeat colonoscopy in 1 year for surveillance                            after piecemeal polypectomy.                           -Restart Imodium today. Procedure Code(s):        --- Professional ---                           [phone], Colonoscopy, flexible; with removal of                            tumor(s), polyp(s), or other lesion(s) by snare                            technique                           45380, 59, Colonoscopy, flexible; with biopsy,  single or multiple Diagnosis Code(s):        --- Professional ---                           D12.2, Benign neoplasm of ascending colon                           D12.3, Benign neoplasm of transverse colon (hepatic                            flexure or splenic flexure)                           K64.8, Other hemorrhoids                           R19.7, Diarrhea, unspecified                           K57.30, Diverticulosis of large intestine without                            perforation or abscess without bleeding CPT copyright 2022 American Medical Association. All rights reserved. The codes documented in this report are preliminary and upon coder review may  be revised to meet current compliance requirements. Toribio Fortune, MD Toribio Fortune,  11/28/2023 10:22:33 AM This report has been signed electronically. Number of Addenda: 0

## 2023-11-28 NOTE — Discharge Instructions (Signed)
 You are being discharged to home.  Resume your previous diet.  We are waiting for your pathology results.  Your physician has recommended a repeat colonoscopy in one year for surveillance after today's piecemeal polypectomy.  Restart Imodium today.

## 2023-11-28 NOTE — Interval H&P Note (Signed)
 History and Physical Interval Note:  11/28/2023 8:20 AM  Caitlyn Cabrera  has presented today for surgery, with the diagnosis of chronic diarrhea.  The various methods of treatment have been discussed with the patient and family. After consideration of risks, benefits and other options for treatment, the patient has consented to  Procedure(s) with comments: COLONOSCOPY (N/A) - 10:00 am, asa 3 as a surgical intervention.  The patient's history has been reviewed, patient examined, no change in status, stable for surgery.  I have reviewed the patient's chart and labs.  Questions were answered to the patient's satisfaction.     [name]

## 2023-11-28 NOTE — Anesthesia Preprocedure Evaluation (Signed)
 Anesthesia Evaluation  Patient identified by MRN, date of birth, ID band Patient awake    Reviewed: Allergy & Precautions, H&P , NPO status , Patient's Chart, lab work & pertinent test results, reviewed documented beta blocker date and time   Airway Mallampati: II  TM Distance: >3 FB Neck ROM: full    Dental no notable dental hx.    Pulmonary neg pulmonary ROS   Pulmonary exam normal breath sounds clear to auscultation       Cardiovascular Exercise Tolerance: Good hypertension,  Rhythm:regular Rate:Normal     Neuro/Psych  Headaches  negative psych ROS   GI/Hepatic negative GI ROS, Neg liver ROS,,,  Endo/Other    Class 4 obesity  Renal/GU negative Renal ROS  negative genitourinary   Musculoskeletal   Abdominal   Peds  Hematology negative hematology ROS (+)   Anesthesia Other Findings   Reproductive/Obstetrics negative OB ROS                             Anesthesia Physical Anesthesia Plan  ASA: 3  Anesthesia Plan: General   Post-op Pain Management:    Induction:   PONV Risk Score and Plan: Propofol  infusion  Airway Management Planned:   Additional Equipment:   Intra-op Plan:   Post-operative Plan:   Informed Consent: I have reviewed the patients History and Physical, chart, labs and discussed the procedure including the risks, benefits and alternatives for the proposed anesthesia with the patient or authorized representative who has indicated his/her understanding and acceptance.     Dental Advisory Given  Plan Discussed with: CRNA  Anesthesia Plan Comments:        Anesthesia Quick Evaluation

## 2023-11-28 NOTE — Transfer of Care (Signed)
 Immediate Anesthesia Transfer of Care Note  Patient: Caitlyn Cabrera  Procedure(s) Performed: COLONOSCOPY  Patient Location: Endoscopy Unit  Anesthesia Type:General  Level of Consciousness: awake, alert , oriented, and patient cooperative  Airway & Oxygen Therapy: Patient Spontanous Breathing  Post-op Assessment: Report given to RN, Post -op Vital signs reviewed and stable, and Patient moving all extremities X 4  Post vital signs: Reviewed and stable  Last Vitals:  Vitals Value Taken Time  BP 118/57 11/28/23 10:22  Temp 36.3 C 11/28/23 10:22  Pulse 86 11/28/23 10:22  Resp 14 11/28/23 10:22  SpO2 97 % 11/28/23 10:22    Last Pain:  Vitals:   11/28/23 1022  PainSc: 0-No pain         Complications: No notable events documented.

## 2023-11-29 ENCOUNTER — Encounter (HOSPITAL_COMMUNITY): Payer: Self-pay | Admitting: Gastroenterology

## 2023-11-29 ENCOUNTER — Ambulatory Visit (INDEPENDENT_AMBULATORY_CARE_PROVIDER_SITE_OTHER): Payer: Self-pay | Admitting: Gastroenterology

## 2023-11-29 LAB — SURGICAL PATHOLOGY

## 2023-11-29 NOTE — Anesthesia Postprocedure Evaluation (Signed)
 Anesthesia Post Note  Patient: Caitlyn Cabrera  Procedure(s) Performed: COLONOSCOPY  Patient location during evaluation: Phase II Anesthesia Type: General Level of consciousness: awake Pain management: pain level controlled Vital Signs Assessment: post-procedure vital signs reviewed and stable Respiratory status: spontaneous breathing and respiratory function stable Cardiovascular status: blood pressure returned to baseline and stable Postop Assessment: no headache and no apparent nausea or vomiting Anesthetic complications: no Comments: Late entry   No notable events documented.   Last Vitals:  Vitals:   11/28/23 0849 11/28/23 1022  BP: 114/75 (!) 118/57  Pulse:  86  Resp: 17 14  Temp: 36.9 C (!) 36.3 C  SpO2: 97% 97%    Last Pain:  Vitals:   11/28/23 1022  PainSc: 0-No pain                 Yvonna JINNY Bosworth

## 2023-12-05 ENCOUNTER — Encounter (INDEPENDENT_AMBULATORY_CARE_PROVIDER_SITE_OTHER): Payer: Self-pay | Admitting: *Deleted

## 2023-12-05 NOTE — Progress Notes (Signed)
 1 yr TCS noted in recall Patient result letter mailed procedure note and pathology result faxed to PCP

## 2024-02-06 DIAGNOSIS — R051 Acute cough: Secondary | ICD-10-CM | POA: Diagnosis not present

## 2024-04-10 ENCOUNTER — Encounter (INDEPENDENT_AMBULATORY_CARE_PROVIDER_SITE_OTHER): Payer: Self-pay | Admitting: Gastroenterology

## 2024-04-11 ENCOUNTER — Encounter (INDEPENDENT_AMBULATORY_CARE_PROVIDER_SITE_OTHER): Payer: Self-pay | Admitting: Gastroenterology
